# Patient Record
Sex: Female | Born: 1995 | Race: Black or African American | Hispanic: No | Smoking: Current every day smoker
Health system: Southern US, Community
[De-identification: ages and names within clinical notes are randomized; demographics above are authoritative.]

## PROBLEM LIST (undated history)

## (undated) ENCOUNTER — Inpatient Hospital Stay (HOSPITAL_COMMUNITY): Payer: Self-pay

## (undated) ENCOUNTER — Inpatient Hospital Stay: Payer: Self-pay

## (undated) DIAGNOSIS — Z3046 Encounter for surveillance of implantable subdermal contraceptive: Secondary | ICD-10-CM

## (undated) DIAGNOSIS — Z789 Other specified health status: Secondary | ICD-10-CM

## (undated) DIAGNOSIS — Z309 Encounter for contraceptive management, unspecified: Secondary | ICD-10-CM

## (undated) DIAGNOSIS — R768 Other specified abnormal immunological findings in serum: Secondary | ICD-10-CM

## (undated) HISTORY — DX: Other specified health status: Z78.9

## (undated) HISTORY — DX: Encounter for surveillance of implantable subdermal contraceptive: Z30.46

## (undated) HISTORY — PX: NO PAST SURGERIES: SHX2092

## (undated) HISTORY — DX: Encounter for contraceptive management, unspecified: Z30.9

## (undated) HISTORY — DX: Other specified abnormal immunological findings in serum: R76.8

---
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
## 2014-08-02 ENCOUNTER — Encounter: Payer: Self-pay | Admitting: Adult Health

## 2014-09-22 ENCOUNTER — Encounter: Payer: Self-pay | Admitting: Advanced Practice Midwife

## 2014-09-22 ENCOUNTER — Ambulatory Visit (INDEPENDENT_AMBULATORY_CARE_PROVIDER_SITE_OTHER): Payer: Medicaid Other | Admitting: Advanced Practice Midwife

## 2014-09-22 ENCOUNTER — Other Ambulatory Visit: Payer: Medicaid Other | Admitting: Advanced Practice Midwife

## 2014-09-22 VITALS — BP 110/60 | Ht 66.0 in | Wt 140.5 lb

## 2014-09-22 DIAGNOSIS — Z113 Encounter for screening for infections with a predominantly sexual mode of transmission: Secondary | ICD-10-CM

## 2014-09-22 DIAGNOSIS — N912 Amenorrhea, unspecified: Secondary | ICD-10-CM

## 2014-09-22 DIAGNOSIS — Z3202 Encounter for pregnancy test, result negative: Secondary | ICD-10-CM

## 2014-09-22 DIAGNOSIS — A599 Trichomoniasis, unspecified: Secondary | ICD-10-CM | POA: Insufficient documentation

## 2014-09-22 LAB — POCT URINE PREGNANCY: PREG TEST UR: NEGATIVE

## 2014-09-22 MED ORDER — METRONIDAZOLE 500 MG PO TABS: 2000.0000 mg | ORAL_TABLET | Freq: Once | ORAL | Status: DC

## 2014-09-22 MED ORDER — NORELGESTROMIN-ETH ESTRADIOL 150-35 MCG/24HR TD PTWK: 1.0000 | MEDICATED_PATCH | TRANSDERMAL | Status: DC

## 2014-09-22 NOTE — Patient Instructions (Addendum)
Trichomoniasis Trichomoniasis is an infection caused by an organism called Trichomonas. The infection can affect both women and men. In women, the outer female genitalia and the vagina are affected. In men, the penis is mainly affected, but the prostate and other reproductive organs can also be involved. Trichomoniasis is a sexually transmitted infection (STI) and is most often passed to another person through sexual contact.  RISK FACTORS  Having unprotected sexual intercourse.  Having sexual intercourse with an infected partner. SIGNS AND SYMPTOMS  Symptoms of trichomoniasis in women include:  Abnormal gray-green frothy vaginal discharge.  Itching and irritation of the vagina.  Itching and irritation of the area outside the vagina. Symptoms of trichomoniasis in men include:   Penile discharge with or without pain.  Pain during urination. This results from inflammation of the urethra. DIAGNOSIS  Trichomoniasis may be found during a Pap test or physical exam. Your health care provider may use one of the following methods to help diagnose this infection:  Examining vaginal discharge under a microscope. For men, urethral discharge would be examined.  Testing the pH of the vagina with a test tape.  Using a vaginal swab test that checks for the Trichomonas organism. A test is available that provides results within a few minutes.  Doing a culture test for the organism. This is not usually needed. TREATMENT   You may be given medicine to fight the infection. Women should inform their health care provider if they could be or are pregnant. Some medicines used to treat the infection should not be taken during pregnancy.  Your health care provider may recommend over-the-counter medicines or creams to decrease itching or irritation.  Your sexual partner will need to be treated if infected. HOME CARE INSTRUCTIONS   Take medicines only as directed by your health care provider.  Take  over-the-counter medicine for itching or irritation as directed by your health care provider.  Do not have sexual intercourse while you have the infection.  Women should not douche or wear tampons while they have the infection.  Discuss your infection with your partner. Your partner may have gotten the infection from you, or you may have gotten it from your partner.  Have your sex partner get examined and treated if necessary.  Practice safe, informed, and protected sex.  See your health care provider for other STI testing. SEEK MEDICAL CARE IF:   You still have symptoms after you finish your medicine.  You develop abdominal pain.  You have pain when you urinate.  You have bleeding after sexual intercourse.  You develop a rash.  Your medicine makes you sick or makes you throw up (vomit). MAKE SURE YOU:  Understand these instructions.  Will watch your condition.  Will get help right away if you are not doing well or get worse. Document Released: 03/13/2001 Document Revised: 02/01/2014 Document Reviewed: 06/29/2013 [facility] Patient Information 2015 [employer] [place]. This information is not intended to replace advice given to you by your health care provider. Make sure you discuss any questions you have with your health care provider. Ethinyl Estradiol; Norelgestromin skin patches What is this medicine? ETHINYL ESTRADIOL;NORELGESTROMIN (ETH in il es tra DYE ole; nor el JES troe min) skin patch is used as a contraceptive (birth control method). This medicine combines two types of female hormones, an estrogen and a progestin. This patch is used to prevent ovulation and pregnancy. This medicine may be used for other purposes; ask your health care provider or pharmacist if you have questions.  COMMON BRAND NAME(S): Ortho Christianne BorrowEvra, Xulane What should I tell my health care provider before I take this medicine? They need to know if you have or ever had any of these conditions: -abnormal  vaginal bleeding -blood vessel disease or blood clots -breast, cervical, endometrial, ovarian, liver, or uterine cancer -diabetes -gallbladder disease -heart disease or recent heart attack -high blood pressure -high cholesterol -kidney disease -liver disease -migraine headaches -stroke -systemic lupus erythematosus (SLE) -tobacco smoker -an unusual or allergic reaction to estrogens, progestins, other medicines, foods, dyes, or preservatives -pregnant or trying to get pregnant -breast-feeding How should I use this medicine? This patch is applied to the skin. Follow the directions on the prescription label. Apply to clean, dry, healthy skin on the buttock, abdomen, upper outer arm or upper torso, in a place where it will not be rubbed by tight clothing. Do not use lotions or other cosmetics on the site where the patch will go. Press the patch firmly in place for 10 seconds to ensure good contact with the skin. Change the patch every 7 days on the same day of the week for 3 weeks. You will then have a break from the patch for 1 week, after which you will apply a new patch. Do not use your medicine more often than directed. Contact your pediatrician regarding the use of this medicine in children. Special care may be needed. This medicine has been used in female children who have started having menstrual periods. A patient package insert for the product will be given with each prescription and refill. Read this sheet carefully each time. The sheet may change frequently. Overdosage: If you think you have taken too much of this medicine contact a poison control center or emergency room at once. NOTE: This medicine is only for you. Do not share this medicine with others. What if I miss a dose? You will need to replace your patch once a week as directed. If your patch is lost or falls off, contact your health care professional for advice. You may need to use another form of birth control if your patch  has been off for more than 1 day. What may interact with this medicine? -acetaminophen -antibiotics or medicines for infections, especially rifampin, rifabutin, rifapentine, and griseofulvin, and possibly penicillins or tetracyclines -aprepitant -ascorbic acid (vitamin C) -atorvastatin -barbiturate medicines, such as phenobarbital -bosentan -carbamazepine -caffeine -clofibrate -cyclosporine -dantrolene -doxercalciferol -felbamate -grapefruit juice -hydrocortisone -medicines for anxiety or sleeping problems, such as diazepam or temazepam -medicines for diabetes, including pioglitazone -modafinil -mycophenolate -nefazodone -oxcarbazepine -phenytoin -prednisolone -ritonavir or other medicines for HIV infection or AIDS -rosuvastatin -selegiline -soy isoflavones supplements -St. John's wort -tamoxifen or raloxifene -theophylline -thyroid hormones -topiramate -warfarin This list may not describe all possible interactions. Give your health care provider a list of all the medicines, herbs, non-prescription drugs, or dietary supplements you use. Also tell them if you smoke, drink alcohol, or use illegal drugs. Some items may interact with your medicine. What should I watch for while using this medicine? Visit your doctor or health care professional for regular checks on your progress. You will need a regular breast and pelvic exam and Pap smear while on this medicine. Use an additional method of contraception during the first cycle that you use this patch. If you have any reason to think you are pregnant, stop using this medicine right away and contact your doctor or health care professional. If you are using this medicine for hormone related problems, it may take several cycles  of use to see improvement in your condition. Smoking increases the risk of getting a blood clot or having a stroke while you are using hormonal birth control, especially if you are more than 19 years old. You  are strongly advised not to smoke. This medicine can make your body retain fluid, making your fingers, hands, or ankles swell. Your blood pressure can go up. Contact your doctor or health care professional if you feel you are retaining fluid. This medicine can make you more sensitive to the sun. Keep out of the sun. If you cannot avoid being in the sun, wear protective clothing and use sunscreen. Do not use sun lamps or tanning beds/booths. If you wear contact lenses and notice visual changes, or if the lenses begin to feel uncomfortable, consult your eye care specialist. In some women, tenderness, swelling, or minor bleeding of the gums may occur. Notify your dentist if this happens. Brushing and flossing your teeth regularly may help limit this. See your dentist regularly and inform your dentist of the medicines you are taking. If you are going to have elective surgery or a MRI, you may need to stop using this medicine before the surgery or MRI. Consult your health care professional for advice. This medicine does not protect you against HIV infection (AIDS) or any other sexually transmitted diseases. What side effects may I notice from receiving this medicine? Side effects that you should report to your doctor or health care professional as soon as possible: -breast tissue changes or discharge -changes in vaginal bleeding during your period or between your periods -chest pain -coughing up blood -dizziness or fainting spells -headaches or migraines -leg, arm or groin pain -severe or sudden headaches -stomach pain (severe) -sudden shortness of breath -sudden loss of coordination, especially on one side of the body -speech problems -symptoms of vaginal infection like itching, irritation or unusual discharge -tenderness in the upper abdomen -vomiting -weakness or numbness in the arms or legs, especially on one side of the body -yellowing of the eyes or skin Side effects that usually do not  require medical attention (report to your doctor or health care professional if they continue or are bothersome): -breakthrough bleeding and spotting that continues beyond the 3 initial cycles of pills -breast tenderness -mood changes, anxiety, depression, frustration, anger, or emotional outbursts -increased sensitivity to sun or ultraviolet light -nausea -skin rash, acne, or brown spots on the skin -weight gain (slight) This list may not describe all possible side effects. Call your doctor for medical advice about side effects. You may report side effects to FDA at 1-800-FDA-1088. Where should I keep my medicine? Keep out of the reach of children. Store at room temperature between 15 and 30 degrees C (59 and 86 degrees F). Keep the patch in its pouch until time of use. Throw away any unused medicine after the expiration date. Dispose of used patches properly. Since a used patch may still contain active hormones, fold the patch in half so that it sticks to itself prior to disposal. Throw away in a place where children or pets cannot reach. NOTE: This sheet is a summary. It may not cover all possible information. If you have questions about this medicine, talk to your doctor, pharmacist, or health care provider.  2015, [employer]. (2008-09-02 96:04:54)

## 2014-09-22 NOTE — Progress Notes (Signed)
[facility] Visit  Patient name: Kerri Soto MRN [id number]  Date of birth: 01-Sep-1996  CC & HPI:  Kerri Soto is a 18 y.o. African American female presenting today for STD screening and to change birth control.  Boyfriend told her "he had something" and "took pills.".  Has noticed and increased dc without odor.  Forgets to take her pills.  Is a week or so late for her period. [facility]  Wants the patch   Pertinent History Reviewed:  Medical & Surgical Hx:   Past Medical History  Diagnosis Date  . Medical history non-contributory   . Nexplanon removal 10/22/2013  . Contraceptive management 10/22/2013   Past Surgical History  Procedure Laterality Date  . No past surgeries     Current outpatient prescriptions: metroNIDAZOLE (FLAGYL) 500 MG tablet, Take 4 tablets (2,000 mg total) by mouth once., Disp: 4 tablet, Rfl: 0;  norelgestromin-ethinyl estradiol (ORTHO EVRA) 150-35 MCG/24HR transdermal patch, Place 1 patch onto the skin once a week., Disp: 3 patch, Rfl: 12 Social History: Reviewed -  reports that she has quit smoking. Her smoking use included Cigarettes. She smoked 0.00 packs per day. She has never used smokeless tobacco.  Objective Findings:  Vitals: BP 110/60 mmHg  Ht 5\' 6"  (1.676 m)  Wt 140 lb 8 oz (63.73 kg)  BMI 22.69 kg/m2  LMP 08/10/2014  Breastfeeding? No  Physical Examination: General appearance - alert, well appearing, and in no distress Mental status - alert, oriented to person, place, and time Pelvic - Vulva normal; SSE:  Yellow frothy discharge, no odor.  Wet prep + WBC and trichomonas.  No clue or yeast.  Results for orders placed or performed in visit on 09/22/14 (from the past 24 hour(s))  POCT urine pregnancy   Collection Time: 09/22/14  2:11 PM  Result Value Ref Range   Preg Test, Ur Negative      Assessment & Plan:  A:   Trichomonas   Contraception management P:  STD screening,    If Qhcg negative, may start  patch tomorros   F/U 4weeks for POC, Trich   CRESENZO-DISHMAN,Levora Werden CNM 09/22/2014 2:34 PM

## 2014-09-23 ENCOUNTER — Telehealth: Payer: Self-pay | Admitting: Advanced Practice Midwife

## 2014-09-23 LAB — HSV 2 ANTIBODY, IGG: HSV 2 GLYCOPROTEIN G AB, IGG: 11.96 IV — AB

## 2014-09-23 LAB — RPR

## 2014-09-23 LAB — GC/CHLAMYDIA PROBE AMP
CT PROBE, AMP APTIMA: NEGATIVE
GC Probe RNA: NEGATIVE

## 2014-09-23 LAB — HCG, QUANTITATIVE, PREGNANCY: hCG, Beta Chain, Quant, S: <2 m[IU]/mL

## 2014-09-23 LAB — HIV ANTIBODY (ROUTINE TESTING W REFLEX): HIV: NONREACTIVE

## 2014-09-23 NOTE — Telephone Encounter (Signed)
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 Pt told to call back on Monday for those results. Pt verbalized understanding.

## 2014-09-27 ENCOUNTER — Encounter: Payer: Self-pay | Admitting: Advanced Practice Midwife

## 2014-09-27 DIAGNOSIS — R768 Other specified abnormal immunological findings in serum: Secondary | ICD-10-CM

## 2014-09-27 DIAGNOSIS — R7689 Other specified abnormal immunological findings in serum: Secondary | ICD-10-CM | POA: Insufficient documentation

## 2014-09-27 HISTORY — DX: Other specified abnormal immunological findings in serum: R76.8

## 2014-10-20 ENCOUNTER — Ambulatory Visit (INDEPENDENT_AMBULATORY_CARE_PROVIDER_SITE_OTHER): Payer: Medicaid Other | Admitting: Advanced Practice Midwife

## 2014-10-20 ENCOUNTER — Encounter: Payer: Self-pay | Admitting: Advanced Practice Midwife

## 2014-10-20 VITALS — BP 112/80 | Wt 138.0 lb

## 2014-10-20 DIAGNOSIS — A599 Trichomoniasis, unspecified: Secondary | ICD-10-CM

## 2014-10-20 NOTE — Progress Notes (Signed)
[facility] Visit  Patient name: Kerri Soto MRN [id number]  Date of birth: 31-Jan-1996  CC & HPI:  Kerri Petitekeybah T Carmicheal is a 19 y.o. African American female presenting today for POC trich. Counseled about + HSV2  Pertinent History Reviewed:  Medical & Surgical Hx:   Past Medical History  Diagnosis Date  . Medical history non-contributory   . Nexplanon removal 10/22/2013  . Contraceptive management 10/22/2013  . 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 No past surgeries      Current outpatient prescriptions:  .  norelgestromin-ethinyl estradiol (ORTHO EVRA) 150-35 MCG/24HR transdermal patch, Place 1 patch onto the skin once a week., Disp: 3 patch, Rfl: 12 .  metroNIDAZOLE (FLAGYL) 500 MG tablet, Take 4 tablets (2,000 mg total) by mouth once. (Patient not taking: Reported on 10/20/2014), Disp: 4 tablet, Rfl: 0 Social History: Reviewed -  reports that she has quit smoking. Her smoking use included Cigarettes. She has never used smokeless tobacco.  Objective Findings:  Vitals: BP 112/80 mmHg  Wt 138 lb (62.596 kg)  LMP 08/10/2014  Physical Examination: General appearance - alert, well appearing, and in no distress Mental status - alert, oriented to person, place, and time Pelvic - SSE:  On period.  Wet prep negative  No results found for this or any previous visit (from the past 24 hour(s)).   Assessment & Plan:  A:   POC trich, neg P:     F/U prn Advised condoms   CRESENZO-DISHMAN,Trenna Kiely CNM 10/20/2014 2:56 PM

## 2015-02-08 ENCOUNTER — Encounter (HOSPITAL_COMMUNITY): Payer: Self-pay

## 2015-02-08 ENCOUNTER — Emergency Department (HOSPITAL_COMMUNITY)
Admission: EM | Admit: 2015-02-08 | Discharge: 2015-02-08 | Disposition: A | Discharge status: Home / Self Care | Payer: Medicaid Other | Attending: Emergency Medicine | Admitting: Emergency Medicine

## 2015-02-08 DIAGNOSIS — Z87891 Personal history of nicotine dependence: Secondary | ICD-10-CM | POA: Diagnosis not present

## 2015-02-08 DIAGNOSIS — Z3202 Encounter for pregnancy test, result negative: Secondary | ICD-10-CM | POA: Insufficient documentation

## 2015-02-08 DIAGNOSIS — Z113 Encounter for screening for infections with a predominantly sexual mode of transmission: Secondary | ICD-10-CM | POA: Diagnosis present

## 2015-02-08 DIAGNOSIS — Z202 Contact with and (suspected) exposure to infections with a predominantly sexual mode of transmission: Secondary | ICD-10-CM | POA: Insufficient documentation

## 2015-02-08 LAB — URINALYSIS, ROUTINE W REFLEX MICROSCOPIC
Bilirubin Urine: NEGATIVE
GLUCOSE, UA: NEGATIVE mg/dL
Hgb urine dipstick: NEGATIVE
Ketones, ur: NEGATIVE mg/dL
LEUKOCYTES UA: NEGATIVE
NITRITE: NEGATIVE
PH: 6.5 (ref 5.0–8.0)
Protein, ur: NEGATIVE mg/dL
SPECIFIC GRAVITY, URINE: 1.02 (ref 1.005–1.030)
Urobilinogen, UA: 0.2 mg/dL (ref 0.0–1.0)

## 2015-02-08 LAB — WET PREP, GENITAL
TRICH WET PREP: NONE SEEN
YEAST WET PREP: NONE SEEN

## 2015-02-08 LAB — POC URINE PREG, ED: PREG TEST UR: NEGATIVE

## 2015-02-08 MED ORDER — CEFTRIAXONE SODIUM 250 MG IJ SOLR
250.0000 mg | Freq: Once | INTRAMUSCULAR | Status: AC
  Administered 2015-02-08: 250 mg via INTRAMUSCULAR
  Filled 2015-02-08: qty 250

## 2015-02-08 MED ORDER — LIDOCAINE HCL (PF) 1 % IJ SOLN
INTRAMUSCULAR | Status: AC
Start: 2015-02-08 — End: 2015-02-08
  Administered 2015-02-08: 5 mL
  Filled 2015-02-08: qty 5

## 2015-02-08 MED ORDER — AZITHROMYCIN 250 MG PO TABS
1000.0000 mg | ORAL_TABLET | Freq: Once | ORAL | Status: AC
  Administered 2015-02-08: 1000 mg via ORAL
  Filled 2015-02-08: qty 4

## 2015-02-08 NOTE — Discharge Instructions (Signed)

## 2015-02-08 NOTE — ED Notes (Signed)
Reports boyfriend being checked for STD.s pt requesting to be checked as well.

## 2015-02-09 LAB — RPR: RPR Ser Ql: NONREACTIVE

## 2015-02-09 LAB — GC/CHLAMYDIA PROBE AMP (~~LOC~~) NOT AT ARMC
CHLAMYDIA, DNA PROBE: POSITIVE — AB
NEISSERIA GONORRHEA: NEGATIVE

## 2015-02-09 LAB — HIV ANTIBODY (ROUTINE TESTING W REFLEX): HIV Screen 4th Generation wRfx: NONREACTIVE

## 2015-02-10 ENCOUNTER — Telehealth (HOSPITAL_BASED_OUTPATIENT_CLINIC_OR_DEPARTMENT_OTHER): Payer: Self-pay | Admitting: Emergency Medicine

## 2015-02-10 NOTE — ED Provider Notes (Signed)
CSN: [id number]     Arrival date & time 02/08/15  1528 History   First MD Initiated Contact with Patient 02/08/15 1634     Chief Complaint  Patient presents with  . SEXUALLY TRANSMITTED DISEASE     (Consider location/radiation/quality/duration/timing/severity/associated sxs/prior Treatment) HPI   Judie Petitekeybah T Stelzner is a 19 y.o. female who presents to the Emergency Department requesting evaluation for possible STD.  She states that her sexual partner has noticed a discharge from his penis recently and she wants to be "checked for STD's"  She denies any symptoms at present including abdominal , pelvic pain, vaginal bleeding or discharge.  She also denies multiple sexual partners.     Past Medical History  Diagnosis Date  . Medical history non-contributory   . Nexplanon removal 10/22/2013  . Contraceptive management 10/22/2013  . 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 No past surgeries     Family History  Problem Relation Age of Onset  . Diabetes Father   . Hypertension Father   . Hyperlipidemia Father   . Diabetes Maternal Grandmother   . Diabetes Paternal Grandmother    History  Substance Use Topics  . Smoking status: Former Smoker    Types: Cigarettes  . Smokeless tobacco: Never Used  . Alcohol Use: No     Comment: not now   OB History    Gravida Para Term Preterm AB TAB SAB Ectopic Multiple Living   1 1 1       1      Review of Systems  Constitutional: Negative for fever, chills and fatigue.  HENT: Negative for trouble swallowing.   Respiratory: Negative for shortness of breath.   Cardiovascular: Negative for chest pain.  Gastrointestinal: Negative for nausea, vomiting, abdominal pain and blood in stool.  Genitourinary: Negative for dysuria, hematuria, flank pain, vaginal bleeding, vaginal discharge, vaginal pain and pelvic pain.  Musculoskeletal: Negative for myalgias, back pain, arthralgias, neck pain and neck stiffness.   Skin: Negative for rash.  Neurological: Negative for dizziness, weakness and numbness.  Hematological: Does not bruise/bleed easily.  All other systems reviewed and are negative.     Allergies  Review of patient's allergies indicates no known allergies.  Home Medications   Prior to Admission medications   Medication Sig Start Date End Date Taking? Authorizing Provider  metroNIDAZOLE (FLAGYL) 500 MG tablet Take 4 tablets (2,000 mg total) by mouth once. Patient not taking: Reported on 10/20/2014 09/22/14   Jacklyn ShellFrances Cresenzo-Dishmon, CNM  norelgestromin-ethinyl estradiol (ORTHO EVRA) 150-35 MCG/24HR transdermal patch Place 1 patch onto the skin once a week. Patient not taking: Reported on 02/08/2015 09/22/14   Jacklyn ShellFrances Cresenzo-Dishmon, CNM   BP 107/74 mmHg  Pulse 96  Temp(Src) 99 F (37.2 C) (Oral)  Resp 18  Ht 5\' 6"  (1.676 m)  Wt 136 lb (61.689 kg)  BMI 21.96 kg/m2  SpO2 100%  LMP 01/16/2015 Physical Exam  Constitutional: She is oriented to person, place, and time. She appears well-developed and well-nourished. No distress.  HENT:  Head: Normocephalic and atraumatic.  Mouth/Throat: Oropharynx is clear and moist.  Neck: Normal range of motion. Neck supple.  Cardiovascular: Normal rate, regular rhythm and normal heart sounds.   Pulmonary/Chest: Effort normal and breath sounds normal. No respiratory distress. She exhibits no tenderness.  Abdominal: Soft. She exhibits no distension. There is no tenderness.  Genitourinary: Vagina normal and uterus normal. There is no rash or tenderness on the right labia. There is no rash  or tenderness on the left labia. Cervix exhibits no motion tenderness, no discharge and no friability. Right adnexum displays no mass and no tenderness. Left adnexum displays no mass and no tenderness.  Musculoskeletal: Normal range of motion. She exhibits no tenderness.  Lymphadenopathy:    She has no cervical adenopathy.  Neurological: She is alert and oriented  to person, place, and time. She exhibits normal muscle tone. Coordination normal.  Skin: Skin is warm and dry.  Nursing note and vitals reviewed.   ED Course  Procedures (including critical care time) Labs Review Labs Reviewed  WET PREP, GENITAL - Abnormal; Notable for the following:    Clue Cells Wet Prep HPF POC FEW (*)    WBC, Wet Prep HPF POC MANY (*)    All other components within normal limits  GC/CHLAMYDIA PROBE AMP ([place]) - Abnormal; Notable for the following:    Chlamydia **POSITIVE** (*)    All other components within normal limits  URINALYSIS, ROUTINE W REFLEX MICROSCOPIC  RPR  HIV ANTIBODY (ROUTINE TESTING)  POC URINE PREG, ED    Imaging Review No results found.   EKG Interpretation None      MDM   Final diagnoses:  STD exposure   Pt is well appearing.  VSS.  Denies symptoms at present.  Patient and partner both treated with IM rocephin and zithromax.  Pt advised to f/u with health dept if needed.      Rosey Bathammy Lorea Kupfer, PA-C 02/10/15 2203  Raeford RazorStephen Kohut, MD 02/11/15 [phone]

## 2015-02-11 ENCOUNTER — Telehealth (HOSPITAL_COMMUNITY): Payer: Self-pay | Admitting: *Deleted

## 2015-08-09 ENCOUNTER — Emergency Department (HOSPITAL_COMMUNITY)
Admission: EM | Admit: 2015-08-09 | Discharge: 2015-08-09 | Disposition: A | Discharge status: Home / Self Care | Payer: Medicaid Other | Attending: Emergency Medicine | Admitting: Emergency Medicine

## 2015-08-09 ENCOUNTER — Encounter (HOSPITAL_COMMUNITY): Payer: Self-pay | Admitting: Emergency Medicine

## 2015-08-09 DIAGNOSIS — N390 Urinary tract infection, site not specified: Secondary | ICD-10-CM | POA: Diagnosis not present

## 2015-08-09 DIAGNOSIS — B9689 Other specified bacterial agents as the cause of diseases classified elsewhere: Secondary | ICD-10-CM

## 2015-08-09 DIAGNOSIS — N760 Acute vaginitis: Secondary | ICD-10-CM | POA: Insufficient documentation

## 2015-08-09 DIAGNOSIS — Z8619 Personal history of other infectious and parasitic diseases: Secondary | ICD-10-CM | POA: Insufficient documentation

## 2015-08-09 DIAGNOSIS — Z3202 Encounter for pregnancy test, result negative: Secondary | ICD-10-CM | POA: Diagnosis not present

## 2015-08-09 DIAGNOSIS — Z720 Tobacco use: Secondary | ICD-10-CM | POA: Insufficient documentation

## 2015-08-09 DIAGNOSIS — R300 Dysuria: Secondary | ICD-10-CM | POA: Diagnosis present

## 2015-08-09 LAB — URINALYSIS, ROUTINE W REFLEX MICROSCOPIC
BILIRUBIN URINE: NEGATIVE
GLUCOSE, UA: NEGATIVE mg/dL
Ketones, ur: NEGATIVE mg/dL
Nitrite: NEGATIVE
PROTEIN: 100 mg/dL — AB
UROBILINOGEN UA: 0.2 mg/dL (ref 0.0–1.0)
pH: 6 (ref 5.0–8.0)

## 2015-08-09 LAB — WET PREP, GENITAL
Trich, Wet Prep: NONE SEEN
Yeast Wet Prep HPF POC: NONE SEEN

## 2015-08-09 LAB — PREGNANCY, URINE: PREG TEST UR: NEGATIVE

## 2015-08-09 LAB — URINE MICROSCOPIC-ADD ON

## 2015-08-09 MED ORDER — CEPHALEXIN 500 MG PO CAPS
500.0000 mg | ORAL_CAPSULE | Freq: Once | ORAL | Status: AC
  Administered 2015-08-09: 500 mg via ORAL

## 2015-08-09 MED ORDER — PHENAZOPYRIDINE HCL 100 MG PO TABS
ORAL_TABLET | ORAL | Status: AC
  Filled 2015-08-09: qty 1

## 2015-08-09 MED ORDER — IBUPROFEN 800 MG PO TABS
ORAL_TABLET | ORAL | Status: AC
  Filled 2015-08-09: qty 1

## 2015-08-09 MED ORDER — PHENAZOPYRIDINE HCL 100 MG PO TABS
100.0000 mg | ORAL_TABLET | Freq: Once | ORAL | Status: AC
  Administered 2015-08-09: 100 mg via ORAL

## 2015-08-09 MED ORDER — METRONIDAZOLE 500 MG PO TABS
500.0000 mg | ORAL_TABLET | Freq: Once | ORAL | Status: AC
  Administered 2015-08-09: 500 mg via ORAL
  Filled 2015-08-09: qty 1

## 2015-08-09 MED ORDER — METRONIDAZOLE 500 MG PO TABS: 500.0000 mg | ORAL_TABLET | Freq: Two times a day (BID) | ORAL | Status: DC

## 2015-08-09 MED ORDER — CEPHALEXIN 500 MG PO CAPS
ORAL_CAPSULE | ORAL | Status: AC
  Filled 2015-08-09: qty 1

## 2015-08-09 MED ORDER — CEPHALEXIN 500 MG PO CAPS: 500.0000 mg | ORAL_CAPSULE | Freq: Four times a day (QID) | ORAL | Status: DC

## 2015-08-09 MED ORDER — IBUPROFEN 800 MG PO TABS
800.0000 mg | ORAL_TABLET | Freq: Once | ORAL | Status: AC
  Administered 2015-08-09: 800 mg via ORAL

## 2015-08-09 NOTE — Discharge Instructions (Signed)
Bacterial Vaginosis °Bacterial vaginosis is a vaginal infection that occurs when the normal balance of bacteria in the vagina is disrupted. It results from an overgrowth of certain bacteria. This is the most common vaginal infection in women of childbearing age. Treatment is important to prevent complications, especially in pregnant women, as it can cause a premature delivery. °CAUSES  °Bacterial vaginosis is caused by an increase in harmful bacteria that are normally present in smaller amounts in the vagina. Several different kinds of bacteria can cause bacterial vaginosis. However, the reason that the condition develops is not fully understood. °RISK FACTORS °Certain activities or behaviors can put you at an increased risk of developing bacterial vaginosis, including: °· Having a new sex partner or multiple sex partners. °· Douching. °· Using an intrauterine device (IUD) for contraception. °Women do not get bacterial vaginosis from toilet seats, bedding, swimming pools, or contact with objects around them. °SIGNS AND SYMPTOMS  °Some women with bacterial vaginosis have no signs or symptoms. Common symptoms include: °· Grey vaginal discharge. °· A fishlike odor with discharge, especially after sexual intercourse. °· Itching or burning of the vagina and vulva. °· Burning or pain with urination. °DIAGNOSIS  °Your health care provider will take a medical history and examine the vagina for signs of bacterial vaginosis. A sample of vaginal fluid may be taken. Your health care provider will look at this sample under a microscope to check for bacteria and abnormal cells. A vaginal pH test may also be done.  °TREATMENT  °Bacterial vaginosis may be treated with antibiotic medicines. These may be given in the form of a pill or a vaginal cream. A second round of antibiotics may be prescribed if the condition comes back after treatment. Because bacterial vaginosis increases your risk for sexually transmitted diseases, getting  treated can help reduce your risk for chlamydia, gonorrhea, HIV, and herpes. °HOME CARE INSTRUCTIONS  °· Only take over-the-counter or prescription medicines as directed by your health care provider. °· If antibiotic medicine was prescribed, take it as directed. Make sure you finish it even if you start to feel better. °· Tell all sexual partners that you have a vaginal infection. They should see their health care provider and be treated if they have problems, such as a mild rash or itching. °· During treatment, it is important that you follow these instructions: °· Avoid sexual activity or use condoms correctly. °· Do not douche. °· Avoid alcohol as directed by your health care provider. °· Avoid breastfeeding as directed by your health care provider. °SEEK MEDICAL CARE IF:  °· Your symptoms are not improving after 3 days of treatment. °· You have increased discharge or pain. °· You have a fever. °MAKE SURE YOU:  °· Understand these instructions. °· Will watch your condition. °· Will get help right away if you are not doing well or get worse. °FOR MORE INFORMATION  °Centers for Disease Control and Prevention, Division of STD Prevention: www.cdc.gov/std °American Sexual Health Association (ASHA): www.ashastd.org  °  °This information is not intended to replace advice given to you by your health care provider. Make sure you discuss any questions you have with your health care provider. °  °Document Released: 09/17/2005 Document Revised: 10/08/2014 Document Reviewed: 04/29/2013 °Elsevier Interactive Patient Education ©2016 [employer] ° °Urinary Tract Infection °Urinary tract infections (UTIs) can develop anywhere along your urinary tract. Your urinary tract is your body's drainage system for removing wastes and extra water. Your urinary tract includes two kidneys, two ureters,   a bladder, and a urethra. Your kidneys are a pair of bean-shaped organs. Each kidney is about the size of your fist. They are located below  your ribs, one on each side of your spine. °CAUSES °Infections are caused by microbes, which are microscopic organisms, including fungi, viruses, and bacteria. These organisms are so small that they can only be seen through a microscope. Bacteria are the microbes that most commonly cause UTIs. °SYMPTOMS  °Symptoms of UTIs may vary by age and gender of the patient and by the location of the infection. Symptoms in young women typically include a frequent and intense urge to urinate and a painful, burning feeling in the bladder or urethra during urination. Older women and men are more likely to be tired, shaky, and weak and have muscle aches and abdominal pain. A fever may mean the infection is in your kidneys. Other symptoms of a kidney infection include pain in your back or sides below the ribs, nausea, and vomiting. °DIAGNOSIS °To diagnose a UTI, your caregiver will ask you about your symptoms. Your caregiver will also ask you to provide a urine sample. The urine sample will be tested for bacteria and white blood cells. White blood cells are made by your body to help fight infection. °TREATMENT  °Typically, UTIs can be treated with medication. Because most UTIs are caused by a bacterial infection, they usually can be treated with the use of antibiotics. The choice of antibiotic and length of treatment depend on your symptoms and the type of bacteria causing your infection. °HOME CARE INSTRUCTIONS °· If you were prescribed antibiotics, take them exactly as your caregiver instructs you. Finish the medication even if you feel better after you have only taken some of the medication. °· Drink enough water and fluids to keep your urine clear or pale yellow. °· Avoid caffeine, tea, and carbonated beverages. They tend to irritate your bladder. °· Empty your bladder often. Avoid holding urine for long periods of time. °· Empty your bladder before and after sexual intercourse. °· After a bowel movement, women should cleanse  from front to back. Use each tissue only once. °SEEK MEDICAL CARE IF:  °· You have back pain. °· You develop a fever. °· Your symptoms do not begin to resolve within 3 days. °SEEK IMMEDIATE MEDICAL CARE IF:  °· You have severe back pain or lower abdominal pain. °· You develop chills. °· You have nausea or vomiting. °· You have continued burning or discomfort with urination. °MAKE SURE YOU:  °· Understand these instructions. °· Will watch your condition. °· Will get help right away if you are not doing well or get worse. °  °This information is not intended to replace advice given to you by your health care provider. Make sure you discuss any questions you have with your health care provider. °  °Document Released: 06/27/2005 Document Revised: 06/08/2015 Document Reviewed: 10/26/2011 °Elsevier Interactive Patient Education ©2016 [employer] Inc. ° °

## 2015-08-09 NOTE — ED Notes (Signed)
Pt c/o lower abd pain with spotting. Pt states home pregnancy test was negative.

## 2015-08-09 NOTE — ED Provider Notes (Signed)
TIME SEEN: 2:15 AM  CHIEF COMPLAINT: Dysuria, suprapubic pain  HPI: Pt is a 19 y.o. female who is a G1 P1 who presents emergency department with several days of dysuria, suprapubic pressure. States she has noticed some blood on the toilet paper after she urinates and wipes. States her last period was one week ago. Has had Chlamydia in the past has been treated. Sexual active with one partner. No fevers, chills, nausea, vomiting or diarrhea. No vaginal discharge.  ROS: See HPI Constitutional: no fever  Eyes: no drainage  ENT: no runny nose   Cardiovascular:  no chest pain  Resp: no SOB  GI: no vomiting GU: no dysuria Integumentary: no rash  Allergy: no hives  Musculoskeletal: no leg swelling  Neurological: no slurred speech ROS otherwise negative  PAST MEDICAL HISTORY/PAST SURGICAL HISTORY:  Past Medical History  Diagnosis Date  . Medical history non-contributory   . Nexplanon removal 10/22/2013  . Contraceptive management 10/22/2013  . 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 Authorizing Provider  metroNIDAZOLE (FLAGYL) 500 MG tablet Take 4 tablets (2,000 mg total) by mouth once. Patient not taking: Reported on 10/20/2014 09/22/14   Jacklyn Shell, CNM  norelgestromin-ethinyl estradiol (ORTHO EVRA) 150-35 MCG/24HR transdermal patch Place 1 patch onto the skin once a week. Patient not taking: Reported on 02/08/2015 09/22/14   Jacklyn Shell, CNM    ALLERGIES:  No Known Allergies  SOCIAL HISTORY:  Social History  Substance Use Topics  . Smoking status: Current Every Day Smoker    Types: Cigarettes  . Smokeless tobacco: Never Used  . Alcohol Use: No     Comment: not now    FAMILY HISTORY: Family History  Problem Relation Age of Onset  . Diabetes Father   . Hypertension Father   . Hyperlipidemia Father   . Diabetes Maternal Grandmother   . Diabetes Paternal Grandmother      EXAM: BP 116/86 mmHg  Pulse 81  Temp(Src) 97.4 F (36.3 C) (Oral)  Resp 16  Ht  (1.676 m)  Wt 134 lb (60.782 kg)  BMI 21.64 kg/m2  SpO2 100% CONSTITUTIONAL: Alert and oriented and responds appropriately to questions. Well-appearing; well-nourished, afebrile, smiling, laughing, nontoxic HEAD: Normocephalic EYES: Conjunctivae clear, PERRL ENT: normal nose; no rhinorrhea; moist mucous membranes; pharynx without lesions noted NECK: Supple, no meningismus, no LAD  CARD: RRR; S1 and S2 appreciated; no murmurs, no clicks, no rubs, no gallops RESP: Normal chest excursion without splinting or tachypnea; breath sounds clear and equal bilaterally; no wheezes, no rhonchi, no rales, no hypoxia or respiratory distress, speaking full sentences ABD/GI: Normal bowel sounds; non-distended; soft, minimal suprapubic tenderness, no tenderness at McBurney's point, no rebound, no guarding, no peritoneal signs GU:  Normal external genitalia. No lesions, rashes noted. Patient has no vaginal bleeding on exam. Small amount of thick white vaginal discharge.  No adnexal tenderness or fullness, no cervical motion tenderness. Cervix is not appear friable.   BACK:  The back appears normal and is non-tender to palpation, there is no CVA tenderness EXT: Normal ROM in all joints; non-tender to palpation; no edema; normal capillary refill; no cyanosis, no calf tenderness or swelling    SKIN: Normal color for age and race; warm NEURO: Moves all extremities equally, sensation to light touch intact diffusely, cranial nerves II through XII intact PSYCH: The patient's mood and manner are appropriate. Grooming and personal hygiene are appropriate.  MEDICAL DECISION  MAKING: Patient here with severe pubic pain, dysuria. There is no vaginal bleeding on her pelvic exam. Suspect the blood that she seen on her total paper is from hematuria. She does appear to have a UTI. Culture pending. Pregnancy test negative. She does have  bacterial vaginosis, will treat with Flagyl. Other pelvic cultures pending. We'll discharge with prescriptions for Keflex and Flagyl. She is not tender at McBurney's point. Doubt appendicitis.   I do not feel there is any life-threatening condition present. Discussed all results, exam findings with patient. I feel the patient is safe to be discharged home without further emergent workup. Discussed usual and customary return precautions. Patient and family (if present) verbalize understanding and are comfortable with this plan.  Patient will follow-up with their primary care provider. If they do not have a primary care provider, information for follow-up has been provided to them. All questions have been answered.           Layla MawKristen N Jolly Bleicher, DO 08/09/15 [phone]

## 2015-08-10 LAB — GC/CHLAMYDIA PROBE AMP (~~LOC~~) NOT AT ARMC
Chlamydia: NEGATIVE
NEISSERIA GONORRHEA: NEGATIVE

## 2015-08-10 LAB — URINE CULTURE

## 2015-12-03 ENCOUNTER — Emergency Department (HOSPITAL_COMMUNITY)
Admission: EM | Admit: 2015-12-03 | Discharge: 2015-12-03 | Disposition: A | Discharge status: Home / Self Care | Payer: Medicaid Other | Attending: Emergency Medicine | Admitting: Emergency Medicine

## 2015-12-03 ENCOUNTER — Encounter (HOSPITAL_COMMUNITY): Payer: Self-pay | Admitting: Emergency Medicine

## 2015-12-03 DIAGNOSIS — B86 Scabies: Secondary | ICD-10-CM | POA: Diagnosis not present

## 2015-12-03 DIAGNOSIS — Z792 Long term (current) use of antibiotics: Secondary | ICD-10-CM | POA: Insufficient documentation

## 2015-12-03 DIAGNOSIS — F17210 Nicotine dependence, cigarettes, uncomplicated: Secondary | ICD-10-CM | POA: Insufficient documentation

## 2015-12-03 DIAGNOSIS — R21 Rash and other nonspecific skin eruption: Secondary | ICD-10-CM | POA: Diagnosis present

## 2015-12-03 MED ORDER — PERMETHRIN 5 % EX CREA: TOPICAL_CREAM | CUTANEOUS | Status: DC

## 2015-12-03 NOTE — Discharge Instructions (Signed)

## 2015-12-03 NOTE — ED Provider Notes (Signed)
CSN: [id number]     Arrival date & time 12/03/15  1422 History   First MD Initiated Contact with Patient 12/03/15 1451     Chief Complaint  Patient presents with  . Rash     (Consider location/radiation/quality/duration/timing/severity/associated sxs/prior Treatment) Patient is a 20 y.o. female presenting with rash. The history is provided by the patient. No language interpreter was used.  Rash Location:  Full body Severity:  Mild Timing:  Constant Chronicity:  New Relieved by:  Nothing Ineffective treatments:  None tried  Pt reports her child had scabies.  Pt complains of itching. Past Medical History  Diagnosis Date  . Medical history non-contributory   . Nexplanon removal 10/22/2013  . Contraceptive management 10/22/2013  . 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 No past surgeries     Family History  Problem Relation Age of Onset  . Diabetes Father   . Hypertension Father   . Hyperlipidemia Father   . Diabetes Maternal Grandmother   . Diabetes Paternal Grandmother    Social History  Substance Use Topics  . Smoking status: Current Every Day Smoker -- 0.50 packs/day for 1 years    Types: Cigarettes  . Smokeless tobacco: Never Used  . Alcohol Use: No   OB History    Gravida Para Term Preterm AB TAB SAB Ectopic Multiple Living   Review of Systems  Skin: Positive for rash.  All other systems reviewed and are negative.     Allergies  Review of patient's allergies indicates no known allergies.  Home Medications   Prior to Admission medications   Medication Sig Start Date End Date Taking? Authorizing Provider  cephALEXin (KEFLEX) 500 MG capsule Take 1 capsule (500 mg total) by mouth 4 (four) times daily. 08/09/15   Kristen N Ward, DO  metroNIDAZOLE (FLAGYL) 500 MG tablet Take 1 tablet (500 mg total) by mouth 2 (two) times daily. 08/09/15   Kristen N Ward, DO  permethrin (ELIMITE) 5 % cream Apply to  affected area once 12/03/15   Lonia Skinner Avril Busser, PA-C   BP 97/66 mmHg  Pulse 90  Temp(Src) 98.2 F (36.8 C) (Oral)  Resp 18  Ht  (1.676 m)  Wt 58.968 kg  BMI 20.99 kg/m2  SpO2 100%  LMP 11/26/2015 (LMP Unknown) Physical Exam  Constitutional: She is oriented to person, place, and time. She appears well-developed and well-nourished.  HENT:  Head: Normocephalic.  Eyes: EOM are normal.  Neck: Normal range of motion.  Pulmonary/Chest: Effort normal.  Abdominal: She exhibits no distension.  Musculoskeletal: Normal range of motion.  Neurological: She is alert and oriented to person, place, and time.  Skin:  No rash  Psychiatric: She has a normal mood and affect.  Nursing note and vitals reviewed.   ED Course  Procedures (including critical care time) Labs Review Labs Reviewed - No data to display  Imaging Review No results found. I have personally reviewed and evaluated these images and lab results as part of my medical decision-making.   EKG Interpretation None      MDM Pt counseled on scabies and treatment   Final diagnoses:  Scabies    Meds ordered this encounter  Medications  . permethrin (ELIMITE) 5 % cream    Sig: Apply to affected area once    Dispense:  120 g    Refill:  1    Order  Specific Question:  Supervising Provider    Answer:  Bethann BerkshireZAMMIT, JOSEPH [[id number]]  An After Visit Summary was printed and given to the patient.    Lonia SkinnerLeslie K RidgevilleSofia, PA-C 12/03/15 1556  Bethann BerkshireJoseph Zammit, MD 12/04/15 [phone]

## 2015-12-03 NOTE — ED Notes (Signed)
Per patient daughter seen by PCP on Wednesday and diagnosed with scabies. Patient states she did have itching and rash to back and hands. Denies any now.

## 2016-11-08 ENCOUNTER — Emergency Department (HOSPITAL_COMMUNITY)
Admission: EM | Admit: 2016-11-08 | Discharge: 2016-11-08 | Disposition: A | Discharge status: Home / Self Care | Payer: Self-pay | Attending: Emergency Medicine | Admitting: Emergency Medicine

## 2016-11-08 ENCOUNTER — Emergency Department (HOSPITAL_COMMUNITY): Payer: Self-pay

## 2016-11-08 ENCOUNTER — Encounter (HOSPITAL_COMMUNITY): Payer: Self-pay

## 2016-11-08 DIAGNOSIS — O99331 Smoking (tobacco) complicating pregnancy, first trimester: Secondary | ICD-10-CM | POA: Insufficient documentation

## 2016-11-08 DIAGNOSIS — O26891 Other specified pregnancy related conditions, first trimester: Secondary | ICD-10-CM | POA: Insufficient documentation

## 2016-11-08 DIAGNOSIS — R102 Pelvic and perineal pain: Secondary | ICD-10-CM

## 2016-11-08 DIAGNOSIS — Z3A01 Less than 8 weeks gestation of pregnancy: Secondary | ICD-10-CM | POA: Insufficient documentation

## 2016-11-08 DIAGNOSIS — R110 Nausea: Secondary | ICD-10-CM | POA: Insufficient documentation

## 2016-11-08 DIAGNOSIS — R1084 Generalized abdominal pain: Secondary | ICD-10-CM | POA: Insufficient documentation

## 2016-11-08 DIAGNOSIS — F17210 Nicotine dependence, cigarettes, uncomplicated: Secondary | ICD-10-CM | POA: Insufficient documentation

## 2016-11-08 DIAGNOSIS — N898 Other specified noninflammatory disorders of vagina: Secondary | ICD-10-CM | POA: Insufficient documentation

## 2016-11-08 LAB — URINALYSIS, ROUTINE W REFLEX MICROSCOPIC
BILIRUBIN URINE: NEGATIVE
Glucose, UA: NEGATIVE mg/dL
KETONES UR: 20 mg/dL — AB
LEUKOCYTES UA: NEGATIVE
NITRITE: NEGATIVE
Protein, ur: NEGATIVE mg/dL
Specific Gravity, Urine: 1.025 (ref 1.005–1.030)
pH: 5 (ref 5.0–8.0)

## 2016-11-08 LAB — WET PREP, GENITAL
SPERM: NONE SEEN
Trich, Wet Prep: NONE SEEN
WBC, Wet Prep HPF POC: NONE SEEN
Yeast Wet Prep HPF POC: NONE SEEN

## 2016-11-08 LAB — HCG, QUANTITATIVE, PREGNANCY: HCG, BETA CHAIN, QUANT, S: 1900 m[IU]/mL — AB (ref <5)

## 2016-11-08 LAB — POC URINE PREG, ED: Preg Test, Ur: POSITIVE — AB

## 2016-11-08 MED ORDER — PRENATAL VITAMINS 0.8 MG PO TABS: 1.0000 | ORAL_TABLET | Freq: Every day | ORAL | 0 refills | Status: DC

## 2016-11-08 NOTE — ED Triage Notes (Signed)
Pt states she wants to know if she is pregnant, states she has had lower abd cramping x 2 weeks, states lmp was early January.

## 2016-11-08 NOTE — ED Notes (Signed)
Pelvic Cart to bedside 

## 2016-11-08 NOTE — Discharge Instructions (Signed)
PLEASE RETURN TO THE ER FOR ANY SEVERE ABDOMINAL PAIN OR VAGINAL BLEEDING OVER THE NEXT 48 HOURS

## 2016-11-08 NOTE — ED Provider Notes (Signed)
[facility]-EMERGENCY DEPT Provider Note   CSN: [id number] Arrival date & time: 11/08/16  0131     History   Chief Complaint Chief Complaint  Patient presents with  . Abdominal Pain    x 2 weeks    HPI Judie Petitekeybah T Lukas is a 21 y.o. female.  The history is provided by the patient.  Abdominal Pain   This is a new problem. The current episode started more than 1 week ago. The problem occurs daily. The problem has not changed since onset.The pain is located in the generalized abdominal region. The pain is mild. Pertinent negatives include fever, diarrhea, vomiting and dysuria. Nothing aggravates the symptoms. Nothing relieves the symptoms.   Patient reports abdominal cramping for past 2 weeks She also reports mild nausea No fever/vomiting She thinks she is pregnant (LMP January) but had negative pregnancy tests at home Denies h/o ectopic pregnancy Past Medical History:  Diagnosis Date  . Contraceptive management 10/22/2013  . 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 Medical history non-contributory   . Nexplanon removal 10/22/2013    Patient Active Problem List   Diagnosis Date Noted  . HSV-2 seropositive   . Trichomonal infection 09/22/2014  . Nexplanon removal 10/22/2013  . Contraceptive management 10/22/2013    Past Surgical History:  Procedure Laterality Date  . NO PAST SURGERIES      OB History    Gravida Para Term Preterm AB Living   1 1 1     1    SAB TAB Ectopic Multiple Live Births           1       Home Medications    Prior to Admission medications   Medication Sig Start Date End Date Taking? Authorizing Provider  cephALEXin (KEFLEX) 500 MG capsule Take 1 capsule (500 mg total) by mouth 4 (four) times daily. 08/09/15   Kristen N Ward, DO  metroNIDAZOLE (FLAGYL) 500 MG tablet Take 1 tablet (500 mg total) by mouth 2 (two) times daily. 08/09/15   Kristen N Ward, DO  permethrin (ELIMITE) 5 % cream Apply to affected area once 12/03/15   Elson AreasLeslie K Sofia, PA-C    Family  History Family History  Problem Relation Age of Onset  . Diabetes Father   . Hypertension Father   . Hyperlipidemia Father   . Diabetes Maternal Grandmother   . Diabetes Paternal Grandmother     Social History Social History  Substance Use Topics  . Smoking status: Current Every Day Smoker    Packs/day: 0.50    Years: 1.00    Types: Cigarettes  . Smokeless tobacco: Never Used  . Alcohol use No     Allergies   Patient has no known allergies.   Review of Systems Review of Systems  Constitutional: Negative for fever.  Gastrointestinal: Positive for abdominal pain. Negative for diarrhea and vomiting.  Genitourinary: Negative for dysuria, vaginal bleeding and vaginal discharge.  All other systems reviewed and are negative.    Physical Exam Updated Vital Signs BP 123/76 (BP Location: Right Arm)   Pulse 91   Temp 97.7 F (36.5 C) (Oral)   Resp 16   Ht 5\' 6"  (1.676 m)   Wt 57.6 kg   LMP 10/01/2016   SpO2 100%   BMI 20.50 kg/m   Physical Exam  CONSTITUTIONAL: Well developed/well nourished HEAD: Normocephalic/atraumatic EYES: EOMI/PERRL ENMT: Mucous membranes moist NECK: supple no meningeal signs SPINE/BACK:entire spine nontender CV: S1/S2 noted, no murmurs/rubs/gallops noted LUNGS: Lungs are clear to auscultation bilaterally, no  apparent distress ABDOMEN: soft, nontender, no rebound or guarding, bowel sounds noted throughout abdomen GU:no cva tenderness, small amount of whitish discharge, no vaginal bleeding, no CMT, nurse chaperone present for exam  NEURO: Pt is awake/alert/appropriate, moves all extremitiesx4.  No facial droop.   EXTREMITIES: pulses normal/equal, full ROM SKIN: warm, color normal PSYCH: no abnormalities of mood noted, alert and oriented to situation  ED Treatments / Results  Labs (all labs ordered are listed, but only abnormal results are displayed) Labs Reviewed  WET PREP, GENITAL - Abnormal; Notable for the following:       Result  Value   Clue Cells Wet Prep HPF POC PRESENT (*)    All other components within normal limits  URINALYSIS, ROUTINE W REFLEX MICROSCOPIC - Abnormal; Notable for the following:    APPearance HAZY (*)    Hgb urine dipstick SMALL (*)    Ketones, ur 20 (*)    Bacteria, UA FEW (*)    All other components within normal limits  HCG, QUANTITATIVE, PREGNANCY - Abnormal; Notable for the following:    hCG, Beta Chain, Quant, S 1,900 (*)    All other components within normal limits  POC URINE PREG, ED - Abnormal; Notable for the following:    Preg Test, Ur POSITIVE (*)    All other components within normal limits  GC/CHLAMYDIA PROBE AMP ([place]) NOT AT [facility]    EKG  EKG Interpretation None       Radiology US Ob Comp Less 14 Wks  Result Date: 11/08/2016 CLINICAL DATA:  Right pelvic pain for 2 weeks. Estimated gestational age by LMP is 5 weeks 3 days. Quantitative beta HCG is 1,900. EXAM: OBSTETRIC <14 WK [place] AND TRANSVAGINAL OB US TECHNIQUE: Both transabdominal and transvaginal ultrasound examinations were performed for complete evaluation of the gestation as well as the maternal uterus, adnexal regions, and pelvic cul-de-sac. Transvaginal technique was performed to assess early pregnancy. COMPARISON:  None. FINDINGS: Intrauterine gestational sac: No intrauterine gestational sac identified. Yolk sac:  Not identified. Embryo:  Not Visualized. Cardiac Activity: Not Visualized. Maternal uterus/adnexae: Uterus is anteverted. No myometrial mass lesions identified. Endometrium is mildly thickened and somewhat heterogeneous. No endometrial fluid collections identified. Both ovaries are visualized and appear normal. No abnormal adnexal masses. Flow is demonstrated on both ovaries with color flow Doppler imaging. Small amount of free fluid in the pelvis. IMPRESSION: No intrauterine gestational sac, yolk sac, or fetal pole identified. Differential considerations include intrauterine pregnancy too early to be  sonographically visualized, missed abortion, or ectopic pregnancy. Followup ultrasound is recommended in 10-14 days for further evaluation. Electronically Signed   By: Burman Nieves M.D.   On: 11/08/2016 04:18   US Ob Transvaginal  Result Date: 11/08/2016 CLINICAL DATA:  Right pelvic pain for 2 weeks. Estimated gestational age by LMP is 5 weeks 3 days. Quantitative beta HCG is 1,900. EXAM: OBSTETRIC <14 WK [place] AND TRANSVAGINAL OB US TECHNIQUE: Both transabdominal and transvaginal ultrasound examinations were performed for complete evaluation of the gestation as well as the maternal uterus, adnexal regions, and pelvic cul-de-sac. Transvaginal technique was performed to assess early pregnancy. COMPARISON:  None. FINDINGS: Intrauterine gestational sac: No intrauterine gestational sac identified. Yolk sac:  Not identified. Embryo:  Not Visualized. Cardiac Activity: Not Visualized. Maternal uterus/adnexae: Uterus is anteverted. No myometrial mass lesions identified. Endometrium is mildly thickened and somewhat heterogeneous. No endometrial fluid collections identified. Both ovaries are visualized and appear normal. No abnormal adnexal masses. Flow is demonstrated on both ovaries  with color flow Doppler imaging. Small amount of free fluid in the pelvis. IMPRESSION: No intrauterine gestational sac, yolk sac, or fetal pole identified. Differential considerations include intrauterine pregnancy too early to be sonographically visualized, missed abortion, or ectopic pregnancy. Followup ultrasound is recommended in 10-14 days for further evaluation. Electronically Signed   By: Burman Nieves M.D.   On: 11/08/2016 04:18    Procedures Procedures (including critical care time)  Medications Ordered in ED Medications - No data to display   Initial Impression / Assessment and Plan / ED Course  I have reviewed the triage vital signs and the nursing notes.  Pertinent labs & imaging results that were available during  my care of the patient were reviewed by me and considered in my medical decision making (see chart for details).     3:06 AM Pt will need formal US imaging to rule in IUP     PT WITH HCG AT 1900, BUT ULTRASOUND DOES NOT REVEAL IUP SHE IS ASYMPTOMATIC D/W DR Despina Hidden VIA PHONE INFORMED HIM OF QUANT LEVEL AND US FINDINGS HE WILL F/U IN OFFICE ON 2/12 FOR REPEAT QUANT SHE IS TO RETURN IF ANY RETURN OF ABDOMINAL PAIN OR VAGINAL BLEEDING PT AGREEABLE WITH PLAN I ADVISED HER TO QUIT SMOKING  Final Clinical Impressions(s) / ED Diagnoses   Final diagnoses:  Less than [redacted] weeks gestation of pregnancy    New Prescriptions New Prescriptions   PRENATAL MULTIVIT-MIN-FE-FA (PRENATAL VITAMINS) 0.8 MG TABLET    Take 1 tablet by mouth daily.     Zadie Rhine, MD 11/08/16 [id number]

## 2016-11-09 LAB — GC/CHLAMYDIA PROBE AMP (~~LOC~~) NOT AT ARMC
Chlamydia: NEGATIVE
Neisseria Gonorrhea: NEGATIVE

## 2016-11-13 ENCOUNTER — Other Ambulatory Visit: Payer: Self-pay | Admitting: Obstetrics & Gynecology

## 2016-11-13 ENCOUNTER — Telehealth: Payer: Self-pay | Admitting: *Deleted

## 2016-11-13 DIAGNOSIS — O3680X0 Pregnancy with inconclusive fetal viability, not applicable or unspecified: Secondary | ICD-10-CM

## 2016-11-13 NOTE — Telephone Encounter (Signed)
Patient called stating she is having lower abdominal pain on the right side. I advised patient that if she felt like the pain was severe, then [facility]. If she felt that the pain was not bad, we could put her on the schedule for tomorrow at 3pm. Patient stated she would come to the appointment tomorrow. Advised [facility] to [facility] if pain became worse. Patient verbalized understanding.

## 2016-11-14 ENCOUNTER — Encounter: Payer: Self-pay | Admitting: Advanced Practice Midwife

## 2016-11-14 ENCOUNTER — Other Ambulatory Visit: Payer: Self-pay | Admitting: Obstetrics & Gynecology

## 2016-11-14 ENCOUNTER — Ambulatory Visit (INDEPENDENT_AMBULATORY_CARE_PROVIDER_SITE_OTHER): Payer: Self-pay | Admitting: Advanced Practice Midwife

## 2016-11-14 ENCOUNTER — Ambulatory Visit (INDEPENDENT_AMBULATORY_CARE_PROVIDER_SITE_OTHER): Payer: Medicaid Other

## 2016-11-14 VITALS — BP 104/64 | HR 88 | Ht 66.0 in | Wt 125.0 lb

## 2016-11-14 DIAGNOSIS — R1030 Lower abdominal pain, unspecified: Secondary | ICD-10-CM

## 2016-11-14 DIAGNOSIS — R1032 Left lower quadrant pain: Secondary | ICD-10-CM

## 2016-11-14 DIAGNOSIS — O3680X0 Pregnancy with inconclusive fetal viability, not applicable or unspecified: Secondary | ICD-10-CM

## 2016-11-14 DIAGNOSIS — Z3A01 Less than 8 weeks gestation of pregnancy: Secondary | ICD-10-CM

## 2016-11-14 DIAGNOSIS — Z3490 Encounter for supervision of normal pregnancy, unspecified, unspecified trimester: Secondary | ICD-10-CM

## 2016-11-14 NOTE — Progress Notes (Signed)
US 6+3 wks GS w/ys,no fetal pole seen,normal ov's bilat,pt is seeing Drenda FreezeFran following ultrasound

## 2016-11-14 NOTE — Progress Notes (Signed)
[facility] Visit  Patient name: Kerri Soto MRN [id number]  Date of birth: [birth year]  CC & HPI:  Kerri Petitekeybah T Selke is a 21 y.o. African American female presenting today for US. Had UPT+, US last  week did not show anything in the uterus.  Having some lower abdominalk pain (midline) and US today showed GS/YS, no fetal pole yet.  Plans TAB.   Pertinent History Reviewed:  Medical & Surgical Hx:   Past Medical History:  Diagnosis Date  . Contraceptive management 10/22/2013  . 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 Medical history non-contributory   . Nexplanon removal 10/22/2013   Past Surgical History:  Procedure Laterality Date  . NO PAST SURGERIES     Family History  Problem Relation Age of Onset  . Diabetes Father   . Hypertension Father   . Hyperlipidemia Father   . Diabetes Maternal Grandmother   . Diabetes Paternal Grandmother     Current Outpatient Prescriptions:  .  Prenatal Multivit-Min-Fe-FA (PRENATAL VITAMINS) 0.8 MG tablet, Take 1 tablet by mouth daily., Disp: 30 tablet, Rfl: 0 Social History: Reviewed -  reports that she has quit smoking. Her smoking use included Cigarettes. She has a 0.50 pack-year smoking history. She has never used smokeless tobacco.  Review of Systems:   Constitutional: Negative for fever and chills Eyes: Negative for visual disturbances Respiratory: Negative for shortness of breath, dyspnea Cardiovascular: Negative for chest pain or palpitations  Gastrointestinal: Negative for vomiting, diarrhea and constipation; no abdominal pain today, a little cramping Genitourinary: Negative for dysuria and urgency, vaginal irritation or itching Musculoskeletal: Negative for back pain, joint pain, myalgias  Neurological: Negative for dizziness and headaches    Objective Findings:    Physical Examination: General appearance - well appearing, and in no distress Mental status - alert, oriented to person, place, and time Chest:  Normal  respiratory effort Heart - normal rate and regular rhythm Abdomen:  Soft, nontender Pelvic: deferred Musculoskeletal:  Normal range of motion without pain Extremities:  No edema  US 6+3 wks GS w/ys,no fetal pole seen,normal ov's bilat,pt is seeing Drenda FreezeFran following ultrasound  No results found for this or any previous visit (from the past 24 hour(s)).    Assessment & Plan:  A:   Early IUP, viabilitiy unconfirmed P:  Plans TAB; start PNC in 3-4 weeks if changes mind CRESENZO-DISHMAN,Tameya Kuznia CNM 11/14/2016 4:29 PM

## 2016-11-22 ENCOUNTER — Ambulatory Visit: Payer: Medicaid Other | Admitting: Adult Health

## 2016-11-22 ENCOUNTER — Other Ambulatory Visit: Payer: Medicaid Other

## 2018-12-06 IMAGING — US US OB TRANSVAGINAL
1 series · 13 of 28 positions shown · non-contrast
Comparison: None.

CLINICAL DATA: Right pelvic pain for 2 weeks. Estimated gestational
age by LMP is 5 weeks 3 days. Quantitative beta HCG is [DATE].

EXAM:
OBSTETRIC <14 WK US AND TRANSVAGINAL OB US
TECHNIQUE: Both transabdominal and transvaginal ultrasound examinations were
performed for complete evaluation of the gestation as well as the
maternal uterus, adnexal regions, and pelvic cul-de-sac.
Transvaginal technique was performed to assess early pregnancy.

[Series 1: us ob transvaginal · 0.19mm/px · 13 of 67 slices shown]
[im 3/67]
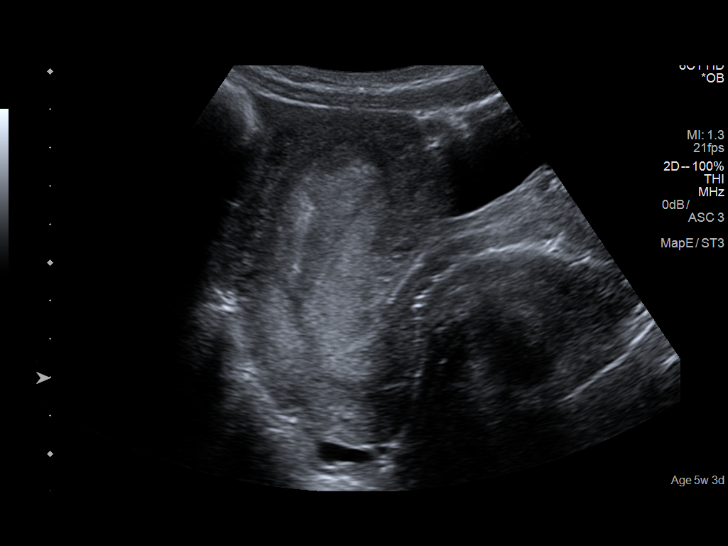
[im 8/67]
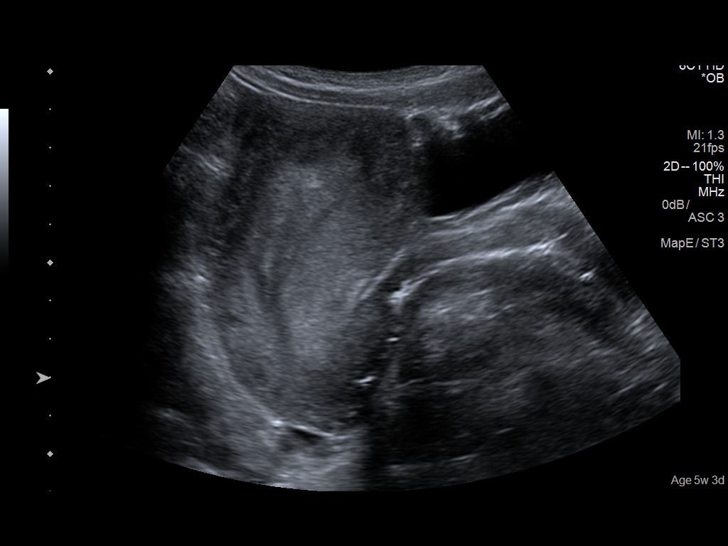
[im 13/67]
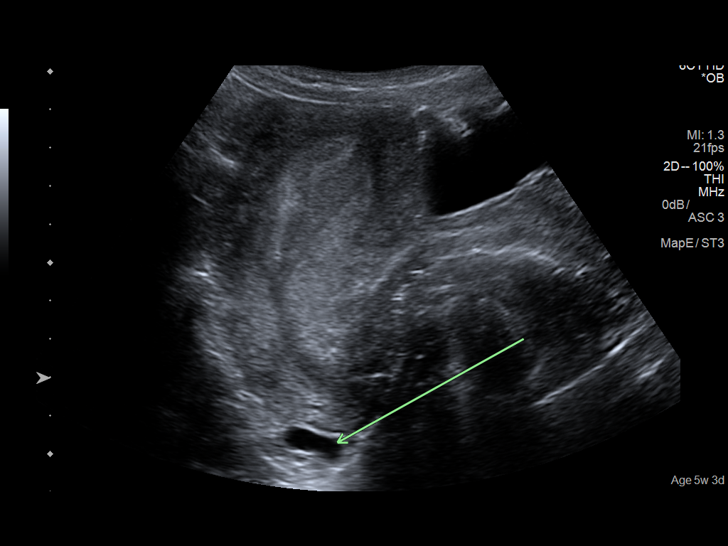
[im 18/67]
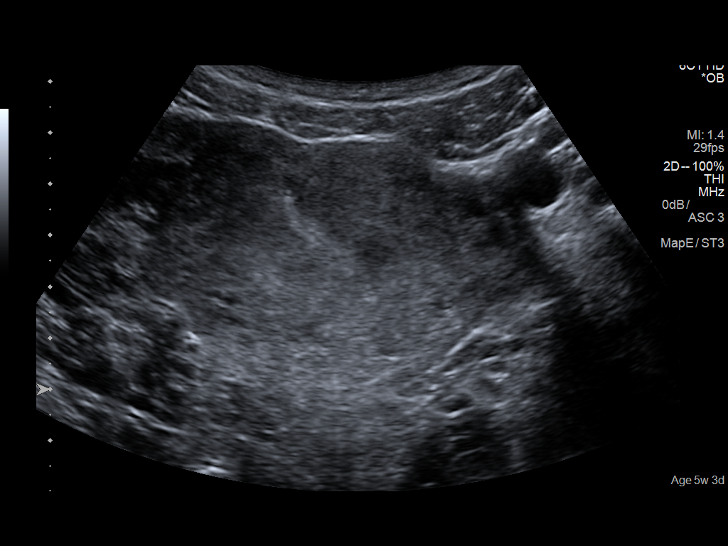
[im 23/67]
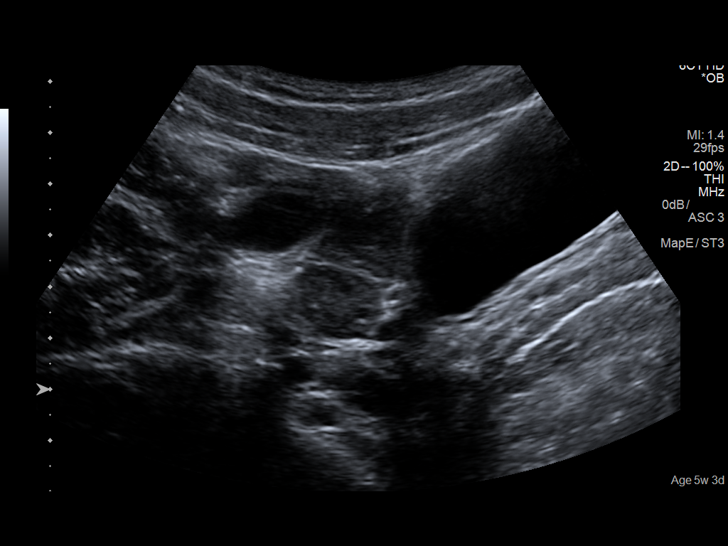
[im 27/67]
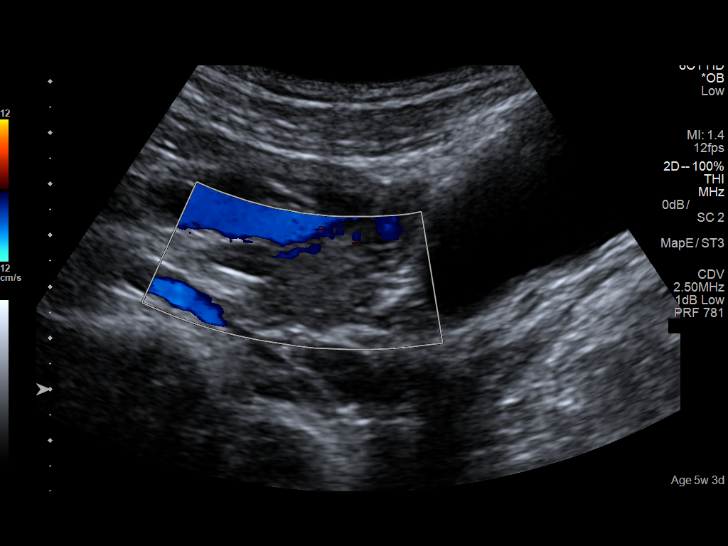
[im 35/67]
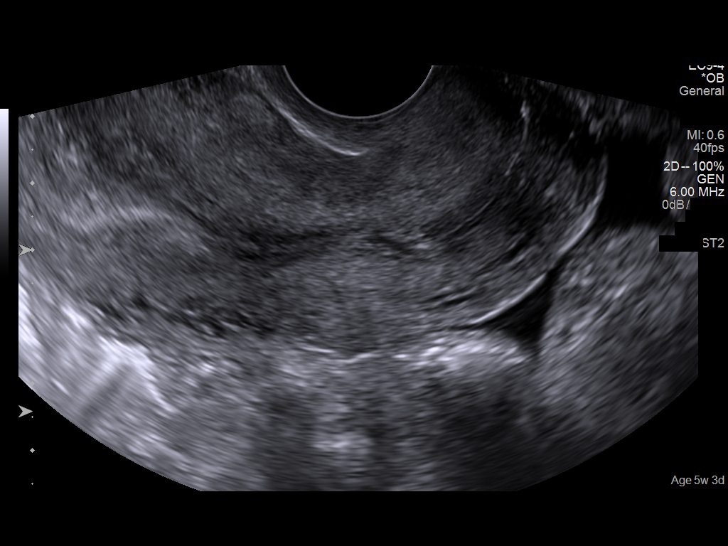
[im 40/67]
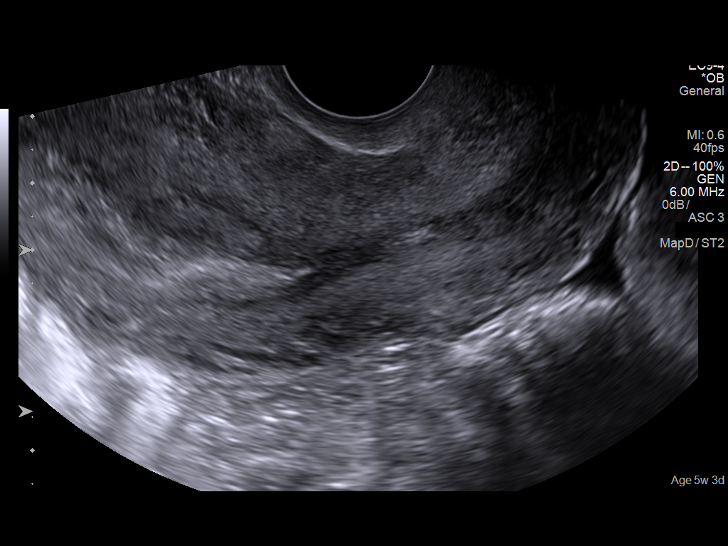
[im 45/67]
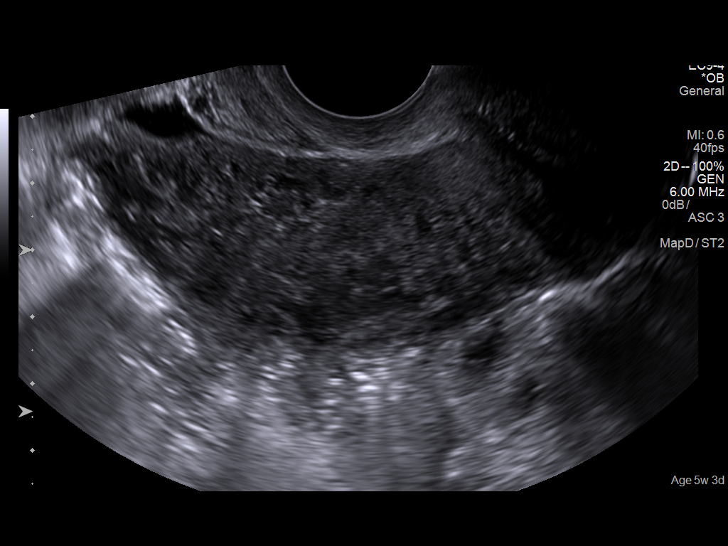
[im 49/67]
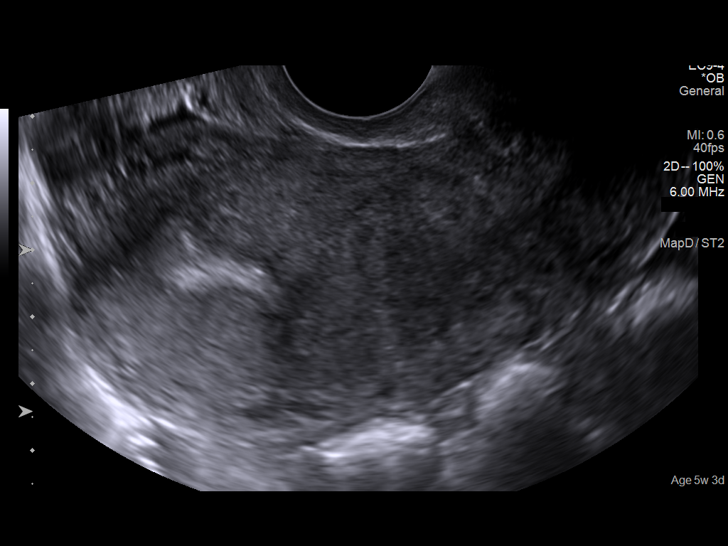
[im 54/67]
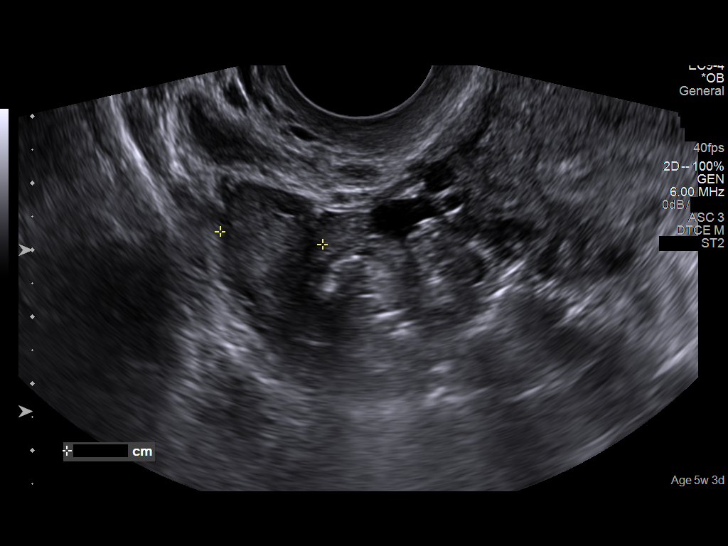
[im 59/67]
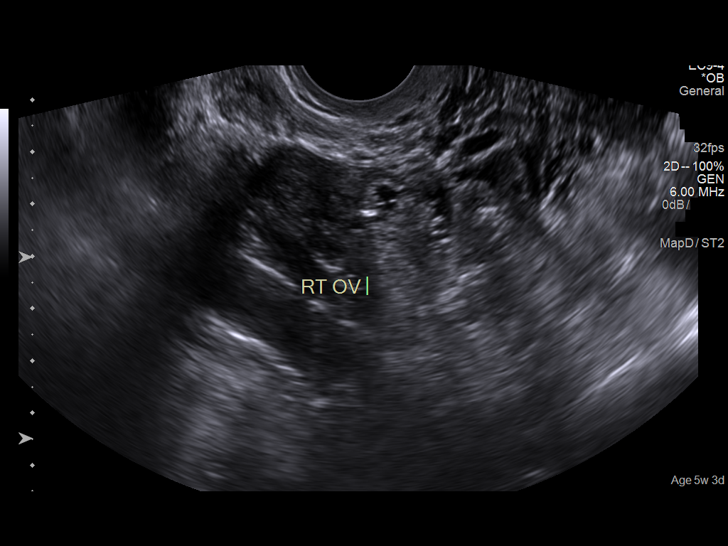
[im 64/67]
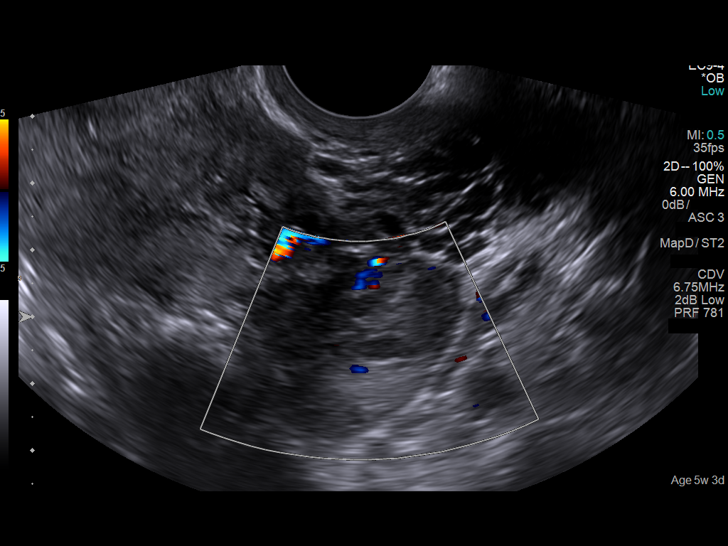

[13 of 28 positions shown; findings below may reference images not displayed]

FINDINGS: Intrauterine gestational sac: No intrauterine gestational sac
identified.

Yolk sac:  Not identified.

Embryo:  Not Visualized.

Cardiac Activity: Not Visualized.

Maternal uterus/adnexae: Uterus is anteverted. No myometrial mass
lesions identified. Endometrium is mildly thickened and somewhat
heterogeneous. No endometrial fluid collections identified. Both
ovaries are visualized and appear normal. No abnormal adnexal
masses. Flow is demonstrated on both ovaries with color flow Doppler
imaging. Small amount of free fluid in the pelvis.
IMPRESSION: No intrauterine gestational sac, yolk sac, or fetal pole identified.
Differential considerations include intrauterine pregnancy too early
to be sonographically visualized, missed abortion, or ectopic
pregnancy. Followup ultrasound is recommended in 10-14 days for
further evaluation.

## 2020-10-01 NOTE — L&D Delivery Note (Signed)
OB/GYN Faculty Practice Delivery Note  Kerri Soto is a 25 y.o. G3P1011 s/p SVD at [redacted]w[redacted]d. She was admitted for IOL for IUFD.   ROM: 0h 60m with clear fluid GBS Status: unknown Maximum Maternal Temperature: 99.5  Labor Progress: Patient was given buccal and vaginal cytotec and progressed to complete with a bulging bag.  Delivery Date/Time: 1840 on 09/21/2021 Delivery: Called to room and patient was complete and pushing. Fetus delivered en call while bag intact. Upon delivery bag ruptured and placenta found to be intact and completely delivered. Cord clamped and cut by Dr. Ephriam Jenkins. Fundus firm with massage and Pitocin. Labia, perineum, vagina, and cervix inspected and no lacerations appreciated. Fetus was wrapped in blanket and given to mom to hold per her request  Placenta: intact, to path Complications: IUFD Lacerations: None EBL: 75cc Analgesia: epidural   Infant: female   APGARs IUFD   weight pending  Warner Mccreedy, MD, MPH OB Fellow, Faculty Practice Center for [facility], [facility] 09/21/2021, 6:00 AM

## 2021-02-11 ENCOUNTER — Encounter (HOSPITAL_COMMUNITY): Payer: Self-pay | Admitting: Emergency Medicine

## 2021-02-11 ENCOUNTER — Other Ambulatory Visit: Payer: Self-pay

## 2021-02-11 ENCOUNTER — Emergency Department (HOSPITAL_COMMUNITY): Payer: Medicaid Other

## 2021-02-11 ENCOUNTER — Emergency Department (HOSPITAL_COMMUNITY)
Admission: EM | Admit: 2021-02-11 | Discharge: 2021-02-11 | Disposition: A | Discharge status: Home / Self Care | Payer: Medicaid Other | Attending: Emergency Medicine | Admitting: Emergency Medicine

## 2021-02-11 DIAGNOSIS — R112 Nausea with vomiting, unspecified: Secondary | ICD-10-CM

## 2021-02-11 DIAGNOSIS — R079 Chest pain, unspecified: Secondary | ICD-10-CM | POA: Diagnosis not present

## 2021-02-11 DIAGNOSIS — Z87891 Personal history of nicotine dependence: Secondary | ICD-10-CM | POA: Diagnosis not present

## 2021-02-11 LAB — COMPREHENSIVE METABOLIC PANEL
ALT: 20 U/L (ref 0–44)
AST: 26 U/L (ref 15–41)
Albumin: 4.7 g/dL (ref 3.5–5.0)
Alkaline Phosphatase: 57 U/L (ref 38–126)
Anion gap: 12 (ref 5–15)
BUN: 18 mg/dL (ref 6–20)
CO2: 28 mmol/L (ref 22–32)
Calcium: 9.4 mg/dL (ref 8.9–10.3)
Chloride: 98 mmol/L (ref 98–111)
Creatinine, Ser: 0.8 mg/dL (ref 0.44–1.00)
GFR, Estimated: >60 mL/min (ref >60)
Glucose, Bld: 131 mg/dL — ABNORMAL HIGH (ref 70–99)
Potassium: 2.9 mmol/L — ABNORMAL LOW (ref 3.5–5.1)
Sodium: 138 mmol/L (ref 135–145)
Total Bilirubin: 0.6 mg/dL (ref 0.3–1.2)
Total Protein: 8.5 g/dL — ABNORMAL HIGH (ref 6.5–8.1)

## 2021-02-11 LAB — LIPASE, BLOOD: Lipase: 25 U/L (ref 11–51)

## 2021-02-11 LAB — CBC WITH DIFFERENTIAL/PLATELET
Abs Immature Granulocytes: 0.04 10*3/uL (ref 0.00–0.07)
Basophils Absolute: 0 10*3/uL (ref 0.0–0.1)
Basophils Relative: 0 %
Eosinophils Absolute: 0 10*3/uL (ref 0.0–0.5)
Eosinophils Relative: 0 %
HCT: 43.4 % (ref 36.0–46.0)
Hemoglobin: 14.4 g/dL (ref 12.0–15.0)
Immature Granulocytes: 0 %
Lymphocytes Relative: 13 %
Lymphs Abs: 1.3 10*3/uL (ref 0.7–4.0)
MCH: 31.4 pg (ref 26.0–34.0)
MCHC: 33.2 g/dL (ref 30.0–36.0)
MCV: 94.6 fL (ref 80.0–100.0)
Monocytes Absolute: 0.7 10*3/uL (ref 0.1–1.0)
Monocytes Relative: 6 %
Neutro Abs: 8.6 10*3/uL — ABNORMAL HIGH (ref 1.7–7.7)
Neutrophils Relative %: 81 %
Platelets: 273 10*3/uL (ref 150–400)
RBC: 4.59 MIL/uL (ref 3.87–5.11)
RDW: 13.6 % (ref 11.5–15.5)
WBC: 10.6 10*3/uL — ABNORMAL HIGH (ref 4.0–10.5)
nRBC: 0 % (ref 0.0–0.2)

## 2021-02-11 MED ORDER — ONDANSETRON 4 MG PO TBDP: ORAL_TABLET | ORAL | 0 refills | Status: DC

## 2021-02-11 MED ORDER — POTASSIUM CHLORIDE CRYS ER 20 MEQ PO TBCR
40.0000 meq | EXTENDED_RELEASE_TABLET | Freq: Once | ORAL | Status: AC
  Administered 2021-02-11: 40 meq via ORAL
  Filled 2021-02-11: qty 2

## 2021-02-11 MED ORDER — MAGNESIUM OXIDE -MG SUPPLEMENT 400 (240 MG) MG PO TABS
800.0000 mg | ORAL_TABLET | Freq: Once | ORAL | Status: AC
  Administered 2021-02-11: 800 mg via ORAL
  Filled 2021-02-11: qty 2

## 2021-02-11 MED ORDER — ALUM & MAG HYDROXIDE-SIMETH 200-200-20 MG/5ML PO SUSP
30.0000 mL | Freq: Once | ORAL | Status: AC
  Administered 2021-02-11: 30 mL via ORAL
  Filled 2021-02-11: qty 30

## 2021-02-11 MED ORDER — ONDANSETRON 4 MG PO TBDP
4.0000 mg | ORAL_TABLET | Freq: Once | ORAL | Status: AC
  Administered 2021-02-11: 4 mg via ORAL
  Filled 2021-02-11: qty 1

## 2021-02-11 MED ORDER — DIPHENHYDRAMINE HCL 50 MG/ML IJ SOLN
25.0000 mg | Freq: Once | INTRAMUSCULAR | Status: AC
  Administered 2021-02-11: 25 mg via INTRAVENOUS
  Filled 2021-02-11: qty 1

## 2021-02-11 MED ORDER — PROCHLORPERAZINE EDISYLATE 10 MG/2ML IJ SOLN
10.0000 mg | Freq: Once | INTRAMUSCULAR | Status: AC
  Administered 2021-02-11: 10 mg via INTRAVENOUS
  Filled 2021-02-11: qty 2

## 2021-02-11 MED ORDER — SODIUM CHLORIDE 0.9 % IV BOLUS
1000.0000 mL | Freq: Once | INTRAVENOUS | Status: AC
  Administered 2021-02-11: 1000 mL via INTRAVENOUS

## 2021-02-11 NOTE — ED Provider Notes (Signed)
[facility] EMERGENCY DEPARTMENT Provider Note   CSN: [id number] Arrival date & time: 02/11/21  0034     History Chief Complaint  Patient presents with  . Hematemesis    Kerri Soto is a 25 y.o. female.  25 yo F with a chief complaints of nausea and vomiting.  Her daughter had a similar illness earlier in the week.  She however is concerned because she feels like her vomit has been streaked with red and she has been having some burning to her chest.  She looked this up online and feels like she probably ruptured her esophagus and came here for evaluation.  No fevers or chills.  No diarrhea.  Has been able to tolerate some by mouth.  The history is provided by the patient.  Illness Severity:  Moderate Onset quality:  Gradual Duration:  2 days Timing:  Constant Progression:  Worsening Chronicity:  New Associated symptoms: chest pain, nausea and vomiting   Associated symptoms: no abdominal pain, no congestion, no fever, no headaches, no myalgias, no rhinorrhea, no shortness of breath and no wheezing        Past Medical History:  Diagnosis Date  . Contraceptive management 10/22/2013  . 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 Medical history non-contributory   . Nexplanon removal 10/22/2013    Patient Active Problem List   Diagnosis Date Noted  . HSV-2 seropositive   . Trichomonal infection 09/22/2014  . Nexplanon removal 10/22/2013  . Contraceptive management 10/22/2013    Past Surgical History:  Procedure Laterality Date  . NO PAST SURGERIES       OB History    Gravida  2   Para  1   Term  1   Preterm      AB      Living  1     SAB      IAB      Ectopic      Multiple      Live Births  1           Family History  Problem Relation Age of Onset  . Diabetes Father   . Hypertension Father   . Hyperlipidemia Father   . Diabetes Maternal Grandmother   . Diabetes Paternal Grandmother     Social History   Tobacco Use  . Smoking status:  Former Smoker    Packs/day: 0.50    Years: 1.00    Pack years: 0.50    Types: Cigarettes  . Smokeless tobacco: Never Used  Substance Use Topics  . Alcohol use: No  . Drug use: No    Home Medications Prior to Admission medications   Medication Sig Start Date End Date Taking? Authorizing Provider  ondansetron (ZOFRAN ODT) 4 MG disintegrating tablet 4mg  ODT q4 hours prn nausea/vomit 02/11/21  Yes 02/13/21, DO  Prenatal Multivit-Min-Fe-FA (PRENATAL VITAMINS) 0.8 MG tablet Take 1 tablet by mouth daily. 11/08/16   01/06/17, MD  norelgestromin-ethinyl estradiol (ORTHO EVRA) 150-35 MCG/24HR transdermal patch Place 1 patch onto the skin once a week. Patient not taking: Reported on 02/08/2015 09/22/14 08/09/15  13/8/16, CNM    Allergies    Patient has no known allergies.  Review of Systems   Review of Systems  Constitutional: Negative for chills and fever.  HENT: Negative for congestion and rhinorrhea.   Eyes: Negative for redness and visual disturbance.  Respiratory: Negative for shortness of breath and wheezing.   Cardiovascular: Positive for chest pain. Negative for palpitations.  Gastrointestinal: Positive  for nausea and vomiting. Negative for abdominal pain.  Genitourinary: Negative for dysuria and urgency.  Musculoskeletal: Negative for arthralgias and myalgias.  Skin: Negative for pallor and wound.  Neurological: Negative for dizziness and headaches.    Physical Exam Updated Vital Signs BP (!) 138/98   Pulse (!) 55   Temp 98.2 F (36.8 C)   Resp 14   Ht 5\' 6"  (1.676 m)   Wt 63.5 kg   LMP 01/30/2021   SpO2 100%   Breastfeeding Unknown   BMI 22.60 kg/m   Physical Exam Vitals and nursing note reviewed.  Constitutional:      General: She is not in acute distress.    Appearance: She is well-developed. She is not diaphoretic.  HENT:     Head: Normocephalic and atraumatic.  Eyes:     Pupils: Pupils are equal, round, and reactive to light.   Cardiovascular:     Rate and Rhythm: Normal rate and regular rhythm.     Heart sounds: No murmur heard. No friction rub. No gallop.   Pulmonary:     Effort: Pulmonary effort is normal.     Breath sounds: No wheezing or rales.  Abdominal:     General: There is no distension.     Palpations: Abdomen is soft.     Tenderness: There is no abdominal tenderness.  Musculoskeletal:        General: No tenderness.     Cervical back: Normal range of motion and neck supple.  Skin:    General: Skin is warm and dry.  Neurological:     Mental Status: She is alert and oriented to person, place, and time.  Psychiatric:        Behavior: Behavior normal.     ED Results / Procedures / Treatments   Labs (all labs ordered are listed, but only abnormal results are displayed) Labs Reviewed  CBC WITH DIFFERENTIAL/PLATELET - Abnormal; Notable for the following components:      Result Value   WBC 10.6 (*)    Neutro Abs 8.6 (*)    All other components within normal limits  COMPREHENSIVE METABOLIC PANEL - Abnormal; Notable for the following components:   Potassium 2.9 (*)    Glucose, Bld 131 (*)    Total Protein 8.5 (*)    All other components within normal limits  LIPASE, BLOOD    EKG EKG Interpretation  Date/Time:  Saturday Feb 11 2021 02:18:07 EDT Ventricular Rate:  55 PR Interval:  158 QRS Duration: 94 QT Interval:  460 QTC Calculation: 440 R Axis:   86 Text Interpretation: Sinus rhythm No old tracing to compare Confirmed by [birth year] [phone]) on 02/11/2021 2:19:18 AM   Radiology DG Chest Port 1 View  Result Date: 02/11/2021 CLINICAL DATA:  Chest pain.  Post emesis.  Former smoker. EXAM: PORTABLE CHEST 1 VIEW COMPARISON:  None. FINDINGS: The heart size and mediastinal contours are within normal limits. No definite pneumomediastinum. No focal consolidation. No pulmonary edema. No pleural effusion. No pneumothorax. No acute osseous abnormality. Metallic density overlying the left neck  likely external to the patient. IMPRESSION: 1. No active disease. 2. Metallic density overlying the left neck likely external to the patient. Please correlate with physical exam. Electronically Signed   By: 02/13/2021 M.D.   On: 02/11/2021 03:08    Procedures Procedures   Medications Ordered in ED Medications  ondansetron (ZOFRAN-ODT) disintegrating tablet 4 mg (4 mg Oral Given 02/11/21 0217)  alum & mag hydroxide-simeth (MAALOX/MYLANTA) 200-200-20  MG/5ML suspension 30 mL (30 mLs Oral Given 02/11/21 0217)  sodium chloride 0.9 % bolus 1,000 mL (0 mLs Intravenous Stopped 02/11/21 0509)  prochlorperazine (COMPAZINE) injection 10 mg (10 mg Intravenous Given 02/11/21 0346)  diphenhydrAMINE (BENADRYL) injection 25 mg (25 mg Intravenous Given 02/11/21 0347)  potassium chloride SA (KLOR-CON) CR tablet 40 mEq (40 mEq Oral Given 02/11/21 0523)  magnesium oxide (MAG-OX) tablet 800 mg (800 mg Oral Given 02/11/21 3790)    ED Course  I have reviewed the triage vital signs and the nursing notes.  Pertinent labs & imaging results that were available during my care of the patient were reviewed by me and considered in my medical decision making (see chart for details).    MDM Rules/Calculators/A&P                          25 yo F with a chief complaints of nausea and vomiting.  Been exposed to someone else with a similar illness.  Well-appearing and nontoxic.  No acute distress.  She was worried that after looking this up online that she may have perforated her esophagus.  Seems unlikely based on her appearance.  Clear lung sounds.  Will obtain a screening chest x-ray.  Attempt to treat her symptoms.  Patient with recurrent nausea and vomiting after Zofran.  IV was started lab work was drawn and the patient was given Compazine and Benadryl with improvement.  She has a mildly low potassium at 2.9.  Mild leukocytosis of 10.6.  She was able to tolerate p.o. and was feeling better and was discharged  home.  5:30 AM:  I have discussed the diagnosis/risks/treatment options with the patient and believe the pt to be eligible for discharge home to follow-up with PCP. We also discussed returning to the ED immediately if new or worsening sx occur. We discussed the sx which are most concerning (e.g., sudden worsening pain, fever, inability to tolerate by mouth) that necessitate immediate return. Medications administered to the patient during their visit and any new prescriptions provided to the patient are listed below.  Medications given during this visit Medications  ondansetron (ZOFRAN-ODT) disintegrating tablet 4 mg (4 mg Oral Given 02/11/21 0217)  alum & mag hydroxide-simeth (MAALOX/MYLANTA) 200-200-20 MG/5ML suspension 30 mL (30 mLs Oral Given 02/11/21 0217)  sodium chloride 0.9 % bolus 1,000 mL (0 mLs Intravenous Stopped 02/11/21 0509)  prochlorperazine (COMPAZINE) injection 10 mg (10 mg Intravenous Given 02/11/21 0346)  diphenhydrAMINE (BENADRYL) injection 25 mg (25 mg Intravenous Given 02/11/21 0347)  potassium chloride SA (KLOR-CON) CR tablet 40 mEq (40 mEq Oral Given 02/11/21 0523)  magnesium oxide (MAG-OX) tablet 800 mg (800 mg Oral Given 02/11/21 0523)     The patient appears reasonably screen and/or stabilized for discharge and I doubt any other medical condition or other [facility] requiring further screening, evaluation, or treatment in the ED at this time prior to discharge.   Final Clinical Impression(s) / ED Diagnoses Final diagnoses:  Nausea and vomiting in adult    Rx / DC Orders ED Discharge Orders         Ordered    ondansetron (ZOFRAN ODT) 4 MG disintegrating tablet        02/11/21 0526           Melene Plan, DO 02/11/21 0530

## 2021-02-11 NOTE — Discharge Instructions (Signed)
Return for worsening pain, fever, inability to eat or drink 

## 2021-02-11 NOTE — ED Triage Notes (Signed)
Pt states she is vomiting blood and that her chest "hurts really bad". Said she looked up her symptoms on Google and is said she could have a "perforated esophagus".

## 2021-02-14 ENCOUNTER — Other Ambulatory Visit: Payer: Self-pay

## 2021-02-14 ENCOUNTER — Emergency Department
Admission: EM | Admit: 2021-02-14 | Discharge: 2021-02-14 | Disposition: A | Discharge status: Home / Self Care | Payer: Medicaid Other | Attending: Emergency Medicine | Admitting: Emergency Medicine

## 2021-02-14 ENCOUNTER — Encounter: Payer: Self-pay | Admitting: Medical Oncology

## 2021-02-14 DIAGNOSIS — R112 Nausea with vomiting, unspecified: Secondary | ICD-10-CM | POA: Diagnosis not present

## 2021-02-14 DIAGNOSIS — Z87891 Personal history of nicotine dependence: Secondary | ICD-10-CM | POA: Diagnosis not present

## 2021-02-14 LAB — CBC WITH DIFFERENTIAL/PLATELET
Abs Immature Granulocytes: 0.04 10*3/uL (ref 0.00–0.07)
Basophils Absolute: 0.1 10*3/uL (ref 0.0–0.1)
Basophils Relative: 1 %
Eosinophils Absolute: 0.1 10*3/uL (ref 0.0–0.5)
Eosinophils Relative: 1 %
HCT: 46.9 % — ABNORMAL HIGH (ref 36.0–46.0)
Hemoglobin: 15.7 g/dL — ABNORMAL HIGH (ref 12.0–15.0)
Immature Granulocytes: 0 %
Lymphocytes Relative: 30 %
Lymphs Abs: 2.9 10*3/uL (ref 0.7–4.0)
MCH: 31.2 pg (ref 26.0–34.0)
MCHC: 33.5 g/dL (ref 30.0–36.0)
MCV: 93.1 fL (ref 80.0–100.0)
Monocytes Absolute: 0.8 10*3/uL (ref 0.1–1.0)
Monocytes Relative: 9 %
Neutro Abs: 5.9 10*3/uL (ref 1.7–7.7)
Neutrophils Relative %: 59 %
Platelets: 260 10*3/uL (ref 150–400)
RBC: 5.04 MIL/uL (ref 3.87–5.11)
RDW: 13.2 % (ref 11.5–15.5)
WBC: 9.9 10*3/uL (ref 4.0–10.5)
nRBC: 0 % (ref 0.0–0.2)

## 2021-02-14 LAB — URINALYSIS, COMPLETE (UACMP) WITH MICROSCOPIC
Bacteria, UA: NONE SEEN
Bilirubin Urine: NEGATIVE
Glucose, UA: NEGATIVE mg/dL
Ketones, ur: 80 mg/dL — AB
Leukocytes,Ua: NEGATIVE
Nitrite: NEGATIVE
Protein, ur: 100 mg/dL — AB
Specific Gravity, Urine: 1.036 — ABNORMAL HIGH (ref 1.005–1.030)
pH: 6 (ref 5.0–8.0)

## 2021-02-14 LAB — PREGNANCY, URINE: Preg Test, Ur: NEGATIVE

## 2021-02-14 LAB — BASIC METABOLIC PANEL
Anion gap: 14 (ref 5–15)
BUN: 20 mg/dL (ref 6–20)
CO2: 24 mmol/L (ref 22–32)
Calcium: 9.3 mg/dL (ref 8.9–10.3)
Chloride: 97 mmol/L — ABNORMAL LOW (ref 98–111)
Creatinine, Ser: 0.87 mg/dL (ref 0.44–1.00)
GFR, Estimated: >60 mL/min (ref >60)
Glucose, Bld: 79 mg/dL (ref 70–99)
Potassium: 3.2 mmol/L — ABNORMAL LOW (ref 3.5–5.1)
Sodium: 135 mmol/L (ref 135–145)

## 2021-02-14 LAB — HEPATIC FUNCTION PANEL
ALT: 16 U/L (ref 0–44)
AST: 21 U/L (ref 15–41)
Albumin: 4.8 g/dL (ref 3.5–5.0)
Alkaline Phosphatase: 61 U/L (ref 38–126)
Bilirubin, Direct: 0.2 mg/dL (ref 0.0–0.2)
Indirect Bilirubin: 1.1 mg/dL — ABNORMAL HIGH (ref 0.3–0.9)
Total Bilirubin: 1.3 mg/dL — ABNORMAL HIGH (ref 0.3–1.2)
Total Protein: 8.4 g/dL — ABNORMAL HIGH (ref 6.5–8.1)

## 2021-02-14 LAB — LIPASE, BLOOD: Lipase: 29 U/L (ref 11–51)

## 2021-02-14 MED ORDER — SODIUM CHLORIDE 0.9 % IV BOLUS
1000.0000 mL | Freq: Once | INTRAVENOUS | Status: AC
  Administered 2021-02-14: 1000 mL via INTRAVENOUS

## 2021-02-14 MED ORDER — FAMOTIDINE 20 MG PO TABS: 20.0000 mg | ORAL_TABLET | Freq: Two times a day (BID) | ORAL | 0 refills | Status: DC

## 2021-02-14 MED ORDER — METOCLOPRAMIDE HCL 10 MG PO TABS: 10.0000 mg | ORAL_TABLET | Freq: Three times a day (TID) | ORAL | 0 refills | Status: DC | PRN

## 2021-02-14 MED ORDER — ONDANSETRON HCL 4 MG/2ML IJ SOLN
4.0000 mg | Freq: Once | INTRAMUSCULAR | Status: AC
  Administered 2021-02-14: 4 mg via INTRAVENOUS
  Filled 2021-02-14: qty 2

## 2021-02-14 NOTE — ED Notes (Signed)
Pt has been provided with discharge instructions. Pt denies any questions or concerns at this time. Pt verbalizes understanding for follow up care and d/c.  VSS.  Pt left department with all belongings.  

## 2021-02-14 NOTE — ED Triage Notes (Signed)
Pt reports that she has been vomiting since last Thursday, states that she went to [employer] and her potassium was low so she was given K tabs. Pt denies pain, states that she is just unable to keep anything solid down.

## 2021-02-14 NOTE — ED Provider Notes (Signed)
[facility] Emergency Department Provider Note ____________________________________________   Event Date/Time   First MD Initiated Contact with Patient 02/14/21 1033     (approximate)  I have reviewed the triage vital signs and the nursing notes.   HISTORY  Chief Complaint No chief complaint on file.    HPI MARIGNY BORRE is a 25 y.o. female with PMH as noted below who presents with persistent nausea and vomiting over the last 5 days, around 5-6 episodes per day, nonbloody and nonbilious currently, and not associated with any abdominal pain or diarrhea.  The patient had a bowel movement earlier today which was normal.  She states that the symptoms initially started after her daughter had similar GI symptoms, however her daughter symptoms have resolved.  The patient was seen in the ED at [facility] several days ago for the same symptoms after she also developed chest pain and had an episode of blood-tinged emesis.  However, since that time there has been no further blood.  She was noted to have a low potassium.   Past Medical History:  Diagnosis Date  . Contraceptive management 10/22/2013  . 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 Medical history non-contributory   . Nexplanon removal 10/22/2013    Patient Active Problem List   Diagnosis Date Noted  . HSV-2 seropositive   . Trichomonal infection 09/22/2014  . Nexplanon removal 10/22/2013  . Contraceptive management 10/22/2013    Past Surgical History:  Procedure Laterality Date  . NO PAST SURGERIES      Prior to Admission medications   Medication Sig Start Date End Date Taking? Authorizing Provider  famotidine (PEPCID) 20 MG tablet Take 1 tablet (20 mg total) by mouth 2 (two) times daily for 15 days. 02/14/21 03/01/21 Yes Dionne Bucy, MD  metoCLOPramide (REGLAN) 10 MG tablet Take 1 tablet (10 mg total) by mouth every 8 (eight) hours as needed for nausea or vomiting. 02/14/21 02/14/22 Yes Dionne Bucy, MD  ondansetron (ZOFRAN ODT) 4 MG disintegrating tablet 4mg  ODT q4 hours prn nausea/vomit 02/11/21   02/13/21, DO  Prenatal Multivit-Min-Fe-FA (PRENATAL VITAMINS) 0.8 MG tablet Take 1 tablet by mouth daily. 11/08/16   01/06/17, MD  norelgestromin-ethinyl estradiol (ORTHO EVRA) 150-35 MCG/24HR transdermal patch Place 1 patch onto the skin once a week. Patient not taking: Reported on 02/08/2015 09/22/14 08/09/15  13/8/16, CNM    Allergies Patient has no known allergies.  Family History  Problem Relation Age of Onset  . Diabetes Father   . Hypertension Father   . Hyperlipidemia Father   . Diabetes Maternal Grandmother   . Diabetes Paternal Grandmother     Social History Social History   Tobacco Use  . Smoking status: Former Smoker    Packs/day: 0.50    Years: 1.00    Pack years: 0.50    Types: Cigarettes  . Smokeless tobacco: Never Used  Substance Use Topics  . Alcohol use: No  . Drug use: No    Review of Systems  Constitutional: No fever/chills. Eyes: No redness. ENT: No sore throat. Cardiovascular: Denies chest pain. Respiratory: Denies shortness of breath. Gastrointestinal: Positive for nausea and vomiting.  No abdominal pain or diarrhea. Genitourinary: Negative for dysuria, frequency, vaginal bleeding.  Musculoskeletal: Negative for back pain. Skin: Negative for rash. Neurological: Negative for headache.   ____________________________________________   PHYSICAL EXAM:  VITAL SIGNS: ED Triage Vitals  Enc Vitals Group     BP 02/14/21 1018 117/85     Pulse Rate  02/14/21 1018 68     Resp 02/14/21 1018 16     Temp 02/14/21 1018 98.2 F (36.8 C)     Temp Source 02/14/21 1018 Oral     SpO2 02/14/21 1018 99 %     Weight 02/14/21 1019 135 lb (61.2 kg)     Height 02/14/21 1019 5\' 7"  (1.702 m)     Head Circumference --      Peak Flow --      Pain Score 02/14/21 1018 0     Pain Loc --      Pain Edu? --      Excl. in GC? --      Constitutional: Alert and oriented. Well appearing and in no acute distress. Eyes: Conjunctivae are normal.  No scleral icterus. Head: Atraumatic. Nose: No congestion/rhinnorhea. Mouth/Throat: Mucous membranes are moist.   Neck: Normal range of motion.  Cardiovascular: Normal rate, regular rhythm. Good peripheral circulation. Respiratory: Normal respiratory effort.  No retractions.  Gastrointestinal: Soft and nontender. No distention.  Genitourinary: No flank tenderness. Musculoskeletal: Extremities warm and well perfused.  Neurologic:  Normal speech and language. No gross focal neurologic deficits are appreciated.  Skin:  Skin is warm and dry. No rash noted. Psychiatric: Mood and affect are normal. Speech and behavior are normal.  ____________________________________________   LABS (all labs ordered are listed, but only abnormal results are displayed)  Labs Reviewed  BASIC METABOLIC PANEL - Abnormal; Notable for the following components:      Result Value   Potassium 3.2 (*)    Chloride 97 (*)    All other components within normal limits  HEPATIC FUNCTION PANEL - Abnormal; Notable for the following components:   Total Protein 8.4 (*)    Total Bilirubin 1.3 (*)    Indirect Bilirubin 1.1 (*)    All other components within normal limits  CBC WITH DIFFERENTIAL/PLATELET - Abnormal; Notable for the following components:   Hemoglobin 15.7 (*)    HCT 46.9 (*)    All other components within normal limits  URINALYSIS, COMPLETE (UACMP) WITH MICROSCOPIC - Abnormal; Notable for the following components:   Color, Urine YELLOW (*)    APPearance HAZY (*)    Specific Gravity, Urine 1.036 (*)    Hgb urine dipstick SMALL (*)    Ketones, ur 80 (*)    Protein, ur 100 (*)    All other components within normal limits  LIPASE, BLOOD  PREGNANCY, URINE  POC URINE PREG, ED    ____________________________________________  EKG   ____________________________________________  RADIOLOGY    ____________________________________________   PROCEDURES  Procedure(s) performed: No  Procedures  Critical Care performed: No ____________________________________________   INITIAL IMPRESSION / ASSESSMENT AND PLAN / ED COURSE  Pertinent labs & imaging results that were available during my care of the patient were reviewed by me and considered in my medical decision making (see chart for details).  25 year old female with PMH as noted above presents with persistent nausea and vomiting over the last 5 days.  She has no associated abdominal pain.  I reviewed the past medical records in epic.  The patient was seen in the ED at [facility] on 5/14.  She had a negative chest x-ray and reassuring labs.  Her potassium was 2.9.  On exam today, the patient is overall well-appearing.  Her vital signs are normal.  The physical exam is unremarkable.  The abdomen is soft and nontender.  Differential includes gastritis, PUD, gastroenteritis, pancreatitis, other hepatobiliary cause.  I have  a low suspicion for other intra-abdominal etiology given the lack of any abdominal pain or tenderness.  There is no indication for imaging at this time.  We will obtain labs, give fluids, and reassess.  ----------------------------------------- 1:26 PM on 02/14/2021 -----------------------------------------  Lab work-up is reassuring.  The patient's bilirubin is minimally elevated, however she has no jaundice, right upper quadrant pain or tenderness, or any other evidence of acute biliary obstruction.  She does have some ketones in the urine consistent with vomiting and dehydration.    The patient endorses relatively heavy alcohol use.  She states that she binges on alcohol a few times per week but does not drink every day.  Her last heavy alcohol use was 3 days ago.  This certainly could  be contributing to the patient's symptoms.  Overall I suspect most likely gastritis.  The patient is now tolerating p.o.  Given her reassuring lab work-up and well appearance, she is appropriate for discharge home.  I will prescribe Pepcid and Reglan.  Return precautions given, and she expresses understanding.  ____________________________________________   FINAL CLINICAL IMPRESSION(S) / ED DIAGNOSES  Final diagnoses:  Nausea and vomiting, intractability of vomiting not specified, unspecified vomiting type      NEW MEDICATIONS STARTED DURING THIS VISIT:  New Prescriptions   FAMOTIDINE (PEPCID) 20 MG TABLET    Take 1 tablet (20 mg total) by mouth 2 (two) times daily for 15 days.   METOCLOPRAMIDE (REGLAN) 10 MG TABLET    Take 1 tablet (10 mg total) by mouth every 8 (eight) hours as needed for nausea or vomiting.     Note:  This document was prepared using Dragon voice recognition software and may include unintentional dictation errors.    Dionne Bucy, MD 02/14/21 1328

## 2021-02-14 NOTE — Discharge Instructions (Addendum)
The Pepcid twice daily for the next few weeks, and the Reglan as needed for nausea.  Avoid alcohol use.  Follow-up with your regular doctor within the next several weeks.  Return to the ER for new, worsening, or persistent severe vomiting, blood in the vomit, abdominal pain, fever, weakness, or any other new or worsening symptoms that concern you.  Your blood work here shows that your bilirubin (which is one of the liver tests) was slightly elevated at 1.5.  This should be rechecked when you follow-up with your doctor.

## 2021-02-14 NOTE — ED Notes (Signed)
Pt given food for PO trial.

## 2021-03-28 ENCOUNTER — Ambulatory Visit (INDEPENDENT_AMBULATORY_CARE_PROVIDER_SITE_OTHER): Payer: Medicaid Other

## 2021-03-28 ENCOUNTER — Other Ambulatory Visit: Payer: Self-pay

## 2021-03-28 VITALS — BP 116/75 | HR 85 | Ht 66.0 in | Wt 170.4 lb

## 2021-03-28 DIAGNOSIS — Z3201 Encounter for pregnancy test, result positive: Secondary | ICD-10-CM

## 2021-03-28 DIAGNOSIS — Z3200 Encounter for pregnancy test, result unknown: Secondary | ICD-10-CM

## 2021-03-28 LAB — POCT URINE PREGNANCY: Preg Test, Ur: POSITIVE — AB

## 2021-03-28 NOTE — Progress Notes (Addendum)
   NURSE VISIT- PREGNANCY CONFIRMATION   SUBJECTIVE:  Kerri Soto is a 25 y.o. G35P1001 female at [redacted]w[redacted]d by uncertain LMP of approximately 03/03/21. Patient's last menstrual period was 03/03/2021 (approximate). Here for pregnancy confirmation.  Home pregnancy test: positive x 1   She reports no complaints.  She is not taking prenatal vitamins.    OBJECTIVE:  BP 116/75 (BP Location: Right Arm, Patient Position: Sitting, Cuff Size: Normal)   Pulse 85   Ht 5\' 6"  (1.676 m)   Wt 170 lb 6.4 oz (77.3 kg)   LMP 03/03/2021 (Approximate)   Breastfeeding No   BMI 27.50 kg/m   Appears well, in no apparent distress  Results for orders placed or performed in visit on 03/28/21 (from the past 24 hour(s))  POCT urine pregnancy   Collection Time: 03/28/21 10:43 AM  Result Value Ref Range   Preg Test, Ur Positive (A) Negative    ASSESSMENT: Positive pregnancy test, [redacted]w[redacted]d by LMP    PLAN: Schedule for dating ultrasound in 5-6 weeks Prenatal vitamins: plans to begin OTC ASAP   Nausea medicines: not currently needed   OB packet given: Yes  Jilberto Vanderwall A Olene Godfrey  03/28/2021 10:47 AM   Chart reviewed for nurse visit. Agree with plan of care.  03/30/2021, Cheral Marker 03/28/2021 11:50 AM

## 2021-03-29 ENCOUNTER — Encounter: Payer: Self-pay | Admitting: Nurse Practitioner

## 2021-03-29 ENCOUNTER — Telehealth: Payer: Medicaid Other | Admitting: Nurse Practitioner

## 2021-03-29 DIAGNOSIS — Z3201 Encounter for pregnancy test, result positive: Secondary | ICD-10-CM | POA: Diagnosis not present

## 2021-03-29 LAB — BETA HCG QUANT (REF LAB): hCG Quant: 45 m[IU]/mL

## 2021-03-29 NOTE — Progress Notes (Signed)
Virtual Visit Consent   [name], you are scheduled for a virtual visit with Kerri Daphine Deutscher, FNP, a [facility] provider, today.     Just as with appointments in the office, your consent must be obtained to participate.  Your consent will be active for this visit and any virtual visit you may have with one of our providers in the next 365 days.     If you have a MyChart account, a copy of this consent can be sent to you electronically.  All virtual visits are billed to your insurance company just like a traditional visit in the office.    As this is a virtual visit, video technology does not allow for your provider to perform a traditional examination.  This may limit your provider's ability to fully assess your condition.  If your provider identifies any concerns that need to be evaluated in person or the need to arrange testing (such as labs, EKG, etc.), we will make arrangements to do so.     Although advances in technology are sophisticated, we cannot ensure that it will always work on either your end or our end.  If the connection with a video visit is poor, the visit may have to be switched to a telephone visit.  With either a video or telephone visit, we are not always able to ensure that we have a secure connection.     I need to obtain your verbal consent now.   Are you willing to proceed with your visit today? YES   OTIS BURRESS has provided verbal consent on 03/29/2021 for a virtual visit (video or telephone).   Kerri Daphine Deutscher, FNP   Date: 03/29/2021 12:20 PM   Virtual Visit via Video Note   I, Kerri Soto, connected with Kerri Soto ([id number], 09-Nov-1995) on 03/29/21 at 12:15 PM EDT by a video-enabled telemedicine application and verified that I am speaking with the correct person using two identifiers.  Location: Patient: Virtual Visit Location Patient: Home Provider: Virtual Visit Location Provider: Mobile   I discussed the  limitations of evaluation and management by telemedicine and the availability of in person appointments. The patient expressed understanding and agreed to proceed.    History of Present Illness: Kerri Soto is a 25 y.o. who identifies as a female who was assigned female at birth, and is being seen today for positive pregnancy test.  HPI: HPI   Patient states she had blood work done at her doctors office and the results show she is pregnant. She said they made her an appointment for sometime in August. She wanted to know why they are waiting to see her in [place], and not sooner. She said she was not having any abdominal pain, bleeding or spotting. No nausea or vomiting. LMP 03/04/21. Problems:  Patient Active Problem List   Diagnosis Date Noted   HSV-2 seropositive    Trichomonal infection 09/22/2014   Nexplanon removal 10/22/2013   Contraceptive management 10/22/2013    Allergies: No Known Allergies Medications: No current outpatient medications on file.  Observations/Objective: Patient is well-developed, well-nourished in no acute distress.  Resting comfortably at home.  Head is normocephalic, atraumatic.  No labored breathing. Speech is clear and coherent with logical content.  Patient is alert and oriented at baseline.  Appears nervous. Is pacing back and forth  Assessment and Plan:  Positive pregnancy  Can get prenatal vitamins OTC and take until seen by doctor NTBS if develops cramping or any signs  of bleeding. EDD March 11,2023  Follow Up Instructions: I discussed the assessment and treatment plan with the patient. The patient was provided an opportunity to ask questions and all were answered. The patient agreed with the plan and demonstrated an understanding of the instructions.  A copy of instructions were sent to the patient via MyChart.  The patient was advised to call back or seek an in-person evaluation if the symptoms worsen or if the condition fails to improve  as anticipated.  Time:  I spent 10 minutes with the patient via telehealth technology discussing the above problems/concerns.    Kerri Daphine Deutscher, FNP

## 2021-05-02 ENCOUNTER — Other Ambulatory Visit: Payer: Self-pay | Admitting: Obstetrics & Gynecology

## 2021-05-02 DIAGNOSIS — O3680X0 Pregnancy with inconclusive fetal viability, not applicable or unspecified: Secondary | ICD-10-CM

## 2021-05-03 ENCOUNTER — Ambulatory Visit (INDEPENDENT_AMBULATORY_CARE_PROVIDER_SITE_OTHER): Payer: Medicaid Other

## 2021-05-03 ENCOUNTER — Other Ambulatory Visit: Payer: Self-pay

## 2021-05-03 DIAGNOSIS — Z3A08 8 weeks gestation of pregnancy: Secondary | ICD-10-CM | POA: Diagnosis not present

## 2021-05-03 DIAGNOSIS — O3680X0 Pregnancy with inconclusive fetal viability, not applicable or unspecified: Secondary | ICD-10-CM

## 2021-05-03 NOTE — Progress Notes (Signed)
[place] 8+4 wks,single IUP with YS,CRL 20.70 mm,fhr 171 bpm,normal ovaries,multiple fibroids,(#1) fundal right intramural fibroid 2.5 x 2 x 2.4 cm,(#2) anterior mid left intramural fibroid 2.4 x 1.9 x 1.8 cm

## 2021-05-29 ENCOUNTER — Other Ambulatory Visit: Payer: Self-pay | Admitting: Obstetrics & Gynecology

## 2021-05-29 DIAGNOSIS — Z3682 Encounter for antenatal screening for nuchal translucency: Secondary | ICD-10-CM

## 2021-05-30 ENCOUNTER — Encounter: Payer: Self-pay | Admitting: Women's Health

## 2021-05-30 ENCOUNTER — Ambulatory Visit (INDEPENDENT_AMBULATORY_CARE_PROVIDER_SITE_OTHER): Payer: Medicaid Other

## 2021-05-30 ENCOUNTER — Other Ambulatory Visit: Payer: Self-pay

## 2021-05-30 ENCOUNTER — Ambulatory Visit (INDEPENDENT_AMBULATORY_CARE_PROVIDER_SITE_OTHER): Payer: Medicaid Other | Admitting: Women's Health

## 2021-05-30 ENCOUNTER — Ambulatory Visit: Payer: Medicaid Other | Admitting: *Deleted

## 2021-05-30 VITALS — BP 113/74 | HR 93 | Wt 160.0 lb

## 2021-05-30 DIAGNOSIS — Z6791 Unspecified blood type, Rh negative: Secondary | ICD-10-CM

## 2021-05-30 DIAGNOSIS — O26899 Other specified pregnancy related conditions, unspecified trimester: Secondary | ICD-10-CM

## 2021-05-30 DIAGNOSIS — Z3A12 12 weeks gestation of pregnancy: Secondary | ICD-10-CM

## 2021-05-30 DIAGNOSIS — Z3480 Encounter for supervision of other normal pregnancy, unspecified trimester: Secondary | ICD-10-CM

## 2021-05-30 DIAGNOSIS — Z3490 Encounter for supervision of normal pregnancy, unspecified, unspecified trimester: Secondary | ICD-10-CM | POA: Insufficient documentation

## 2021-05-30 DIAGNOSIS — Z1379 Encounter for other screening for genetic and chromosomal anomalies: Secondary | ICD-10-CM

## 2021-05-30 DIAGNOSIS — Z3481 Encounter for supervision of other normal pregnancy, first trimester: Secondary | ICD-10-CM

## 2021-05-30 DIAGNOSIS — Z3682 Encounter for antenatal screening for nuchal translucency: Secondary | ICD-10-CM

## 2021-05-30 MED ORDER — BLOOD PRESSURE MONITOR MISC: 0 refills | Status: DC

## 2021-05-30 NOTE — Progress Notes (Signed)
INITIAL OBSTETRICAL VISIT Patient name: Kerri Soto MRN [id number]  Date of birth: Sep 09, 1996 Chief Complaint:   Initial Prenatal Visit  History of Present Illness:   Kerri Soto is a 25 y.o. G29P1011 African-American female at [redacted]w[redacted]d by LMP c/w u/s at 8 weeks with an Estimated Date of Delivery: 12/09/21 being seen today for her initial obstetrical visit.   Patient's last menstrual period was 03/04/2021 (exact date). Her obstetrical history is significant for  term uncomplicated svb, SAB x 1 .   Today she reports nausea and headaches Last pap pt thought she had one here-do not see in EPIC. Results were: N/A. Declines today, wants to do next visit.  Depression screen PHQ 2/9 05/30/2021  Decreased Interest 1  Down, Depressed, Hopeless 1  PHQ - 2 Score 2  Altered sleeping 1  Tired, decreased energy 1  Change in appetite 1  Feeling bad or failure about yourself  0  Trouble concentrating 0  Moving slowly or fidgety/restless 0  Suicidal thoughts 0  PHQ-9 Score 5     GAD 7 : Generalized Anxiety Score 05/30/2021  Nervous, Anxious, on Edge 0  Control/stop worrying 1  Worry too much - different things 1  Trouble relaxing 0  Restless 1  Easily annoyed or irritable 1  Afraid - awful might happen 0  Total GAD 7 Score 4     Review of Systems:   Pertinent items are noted in HPI Denies cramping/contractions, leakage of fluid, vaginal bleeding, abnormal vaginal discharge w/ itching/odor/irritation, headaches, visual changes, shortness of breath, chest pain, abdominal pain, severe nausea/vomiting, or problems with urination or bowel movements unless otherwise stated above.  Pertinent History Reviewed:  Reviewed past medical,surgical, social, obstetrical and family history.  Reviewed problem list, medications and allergies. OB History  Gravida Para Term Preterm AB Living  3 1 1   1 1   SAB IAB Ectopic Multiple Live Births  1       1    # Outcome Date GA Lbr Len/2nd Weight  Sex Delivery Anes PTL Lv  3 Current           2 SAB 11/14/16          1 Term 04/19/13 [redacted]w[redacted]d 05:37 / 00:33 6 lb 12.3 oz (3.07 kg) F Vag-Spont EPI  LIV     Birth Comments: WNL   Physical Assessment:   Vitals:   05/30/21 1054  BP: 113/74  Pulse: 93  Weight: 160 lb (72.6 kg)  Body mass index is 25.82 kg/m.       Physical Examination:  General appearance - well appearing, and in no distress  Mental status - alert, oriented to person, place, and time  Psych:  She has a normal mood and affect  Skin - warm and dry, normal color, no suspicious lesions noted  Chest - effort normal, all lung fields clear to auscultation bilaterally  Heart - normal rate and regular rhythm  Abdomen - soft, nontender  Extremities:  No swelling or varicosities noted  Thin prep pap is not done - declines, wants to do next visit  Chaperone: N/A    TODAY'S NT 06/01/21 12+3 wks,measurements c/w dates,crl 64.24 mm,fhr 164 bpm,normal ovaries,posterior placenta,NT 1.3 mm,NB present  No results found for this or any previous visit (from the past 24 hour(s)).  Assessment & Plan:  1) Low-Risk Pregnancy G3P1011 at [redacted]w[redacted]d with an Estimated Date of Delivery: 12/09/21   2) Initial OB visit  3) Nausea> declines meds  4) Headaches> gave printed prevention/relief measures   Meds:  Meds ordered this encounter  Medications   Blood Pressure Monitor MISC    Sig: For regular home bp monitoring during pregnancy    Dispense:  1 each    Refill:  0    Please mail to patient    Initial labs obtained Continue prenatal vitamins Reviewed n/v relief measures and warning s/s to report Reviewed recommended weight gain based on pre-gravid BMI Encouraged well-balanced diet Genetic & carrier screening discussed: requests Panorama, NT/IT, and Horizon  Ultrasound discussed; fetal survey: requested CCNC completed> form faxed if has or is planning to apply for medicaid The nature of [employer] for [employer] with  multiple MDs and other Advanced Practice Providers was explained to patient; also emphasized that fellows, residents, and students are part of our team. Does not have home bp cuff. Office bp cuff given: no. Rx sent: yes. Check bp weekly, let us know if consistently >140/90.   Follow-up: Return in about 4 weeks (around 06/27/2021) for LROB w/ pap, 2nd IT, CNM, in person.   Orders Placed This Encounter  Procedures   Urine Culture   CBC/D/Plt+RPR+Rh+ABO+RubIgG...   Pain Management Screening Profile (10S)   Integrated 1   POC Urinalysis Dipstick OB    Cheral Marker CNM, [facility] 05/30/2021 11:40 AM

## 2021-05-30 NOTE — Patient Instructions (Addendum)
Kerri Soto, thank you for choosing our office today! We appreciate the opportunity to meet your healthcare needs. You may receive a short survey by mail, e-mail, or through [employer]. If you are happy with your care we would appreciate if you could take just a few minutes to complete the survey questions. We read all of your comments and take your feedback very seriously. Thank you again for choosing our office.  Center for [employer] [facility] Team at [facility]  [facility] at [facility] ([address] [place], [place] [address]) Entrance C, located off of [address] Free 24/7 valet parking   For Headaches:  Stay well hydrated, drink enough water so that your urine is clear, sometimes if you are dehydrated you can get headaches Eat small frequent meals and snacks, sometimes if you are hungry you can get headaches Sometimes you get headaches during pregnancy from the pregnancy hormones You can try tylenol (1-2 regular strength 325mg  or 1-2 extra strength 500mg ) as directed on the box. The least amount of medication that works is best.  Cool compresses (cool wet washcloth or ice pack) to area of head that is hurting You can also try drinking a caffeinated drink to see if this will help If not helping, try below:  For Prevention of Headaches/Migraines: CoQ10 100mg  three times daily Vitamin B2 400mg  daily Magnesium Oxide 400-600mg  daily  Foods to alleviate migraines:  1) dark leafy greens 2) avocado 3) tuna 4) salmon  5) beans and legumes  Foods to avoid: 1) Excessive (or irregular timing) coffee 3) aged cheeses 4) chocolate 5) citrus fruits 6) aspartame and other artifical sweeteners 7) yeast 8) MSG (in processed foods) 9) processed and cured meats 10) nuts and certain seeds 11) chicken livers and other organ meats 12) dairy products like buttermilk, sour cream, and yogurt 13) dried fruits like dates, figs, and raisins 14) garlic 15) onions 16) potato chips 17)  pickled foods like olives and sauerkraut 18) some fresh fruits like ripe banana, papaya, red plums, raspberries, kiwi, pineapple 19) tomato-based products  Recommend to keep a migraine diary: rate daily the severity of your headache (1-10) and what foods you eat that day to help determine patterns.   If You Get a Bad Headache/Migraine: Benadryl 25mg   Magnesium Oxide 1 large Gatorade 2 extra strength Tylenol (1,000mg  total) 1 cup coffee or Coke      If this doesn't help please call @ [phone]   Nausea & Vomiting Have saltine crackers or pretzels by your bed and eat a few bites before you raise your head out of bed in the morning Eat small frequent meals throughout the day instead of large meals Drink plenty of fluids throughout the day to stay hydrated, just don't drink a lot of fluids with your meals.  This can make your stomach fill up faster making you feel sick Do not brush your teeth right after you eat Products with real ginger are good for nausea, like ginger ale and ginger hard candy Make sure it says made with real ginger! Sucking on sour candy like lemon heads is also good for nausea If your prenatal vitamins make you nauseated, take them at night so you will sleep through the nausea Sea Bands If you feel like you need medicine for the nausea & vomiting please let know If you are unable to keep any fluids or food down please let know   Constipation Drink plenty of fluid, preferably water, throughout  the day Eat foods high in fiber such as fruits, vegetables, and grains Exercise, such as walking, is a good way to keep your bowels regular Drink warm fluids, especially warm prune juice, or decaf coffee Eat a 1/2 cup of real oatmeal (not instant), 1/2 cup applesauce, and 1/2-1 cup warm prune juice every day If needed, you may take Colace (docusate sodium) stool softener once or twice a day to help keep the stool soft.  If you still are having problems with  constipation, you may take Miralax once daily as needed to help keep your bowels regular.   Home Blood Pressure Monitoring for Patients   Your provider has recommended that you check your blood pressure (BP) at least once a week at home. If you do not have a blood pressure cuff at home, one will be provided for you. Contact your provider if you have not received your monitor within 1 week.   Helpful Tips for Accurate Home Blood Pressure Checks  Don't smoke, exercise, or drink caffeine 30 minutes before checking your BP Use the restroom before checking your BP (a full bladder can raise your pressure) Relax in a comfortable upright chair Feet on the ground Left arm resting comfortably on a flat surface at the level of your heart Legs uncrossed Back supported Sit quietly and don't talk Place the cuff on your bare arm Adjust snuggly, so that only two fingertips can fit between your skin and the top of the cuff Check 2 readings separated by at least one minute Keep a log of your BP readings For a visual, please reference this diagram: http://ccnc.care/bpdiagram  Provider Name: [facility]     Phone: [phone]  Zone 1: ALL CLEAR  Continue to monitor your symptoms:  BP reading is less than 140 (top number) or less than 90 (bottom number)  No right upper stomach pain No headaches or seeing spots No feeling nauseated or throwing up No swelling in face and hands  Zone 2: CAUTION Call your doctor's office for any of the following:  BP reading is greater than 140 (top number) or greater than 90 (bottom number)  Stomach pain under your ribs in the middle or right side Headaches or seeing spots Feeling nauseated or throwing up Swelling in face and hands  Zone 3: EMERGENCY  Seek immediate medical care if you have any of the following:  BP reading is greater than160 (top number) or greater than 110 (bottom number) Severe headaches not improving with Tylenol Serious difficulty  catching your breath Any worsening symptoms from Zone 2    First Trimester of Pregnancy The first trimester of pregnancy is from week 1 until the end of week 12 (months 1 through 3). A week after a sperm fertilizes an egg, the egg will implant on the wall of the uterus. This embryo will begin to develop into a baby. Genes from you and your partner are forming the baby. The female genes determine whether the baby is a boy or a girl. At 6-8 weeks, the eyes and face are formed, and the heartbeat can be seen on ultrasound. At the end of 12 weeks, all the baby's organs are formed.  Now that you are pregnant, you will want to do everything you can to have a healthy baby. Two of the most important things are to get good prenatal care and to follow your health care provider's instructions. Prenatal care is all the medical care you receive before the baby's birth. This care  will help prevent, find, and treat any problems during the pregnancy and childbirth. BODY CHANGES Your body goes through many changes during pregnancy. The changes vary from woman to woman.  You may gain or lose a couple of pounds at first. You may feel sick to your stomach (nauseous) and throw up (vomit). If the vomiting is uncontrollable, call your health care provider. You may tire easily. You may develop headaches that can be relieved by medicines approved by your health care provider. You may urinate more often. Painful urination may mean you have a bladder infection. You may develop heartburn as a result of your pregnancy. You may develop constipation because certain hormones are causing the muscles that push waste through your intestines to slow down. You may develop hemorrhoids or swollen, bulging veins (varicose veins). Your breasts may begin to grow larger and become tender. Your nipples may stick out more, and the tissue that surrounds them (areola) may become darker. Your gums may bleed and may be sensitive to brushing and  flossing. Dark spots or blotches (chloasma, mask of pregnancy) may develop on your face. This will likely fade after the baby is born. Your menstrual periods will stop. You may have a loss of appetite. You may develop cravings for certain kinds of food. You may have changes in your emotions from day to day, such as being excited to be pregnant or being concerned that something may go wrong with the pregnancy and baby. You may have more vivid and strange dreams. You may have changes in your hair. These can include thickening of your hair, rapid growth, and changes in texture. Some women also have hair loss during or after pregnancy, or hair that feels dry or thin. Your hair will most likely return to normal after your baby is born. WHAT TO EXPECT AT YOUR PRENATAL VISITS During a routine prenatal visit: You will be weighed to make sure you and the baby are growing normally. Your blood pressure will be taken. Your abdomen will be measured to track your baby's growth. The fetal heartbeat will be listened to starting around week 10 or 12 of your pregnancy. Test results from any previous visits will be discussed. Your health care provider may ask you: How you are feeling. If you are feeling the baby move. If you have had any abnormal symptoms, such as leaking fluid, bleeding, severe headaches, or abdominal cramping. If you have any questions. Other tests that may be performed during your first trimester include: Blood tests to find your blood type and to check for the presence of any previous infections. They will also be used to check for low iron levels (anemia) and Rh antibodies. Later in the pregnancy, blood tests for diabetes will be done along with other tests if problems develop. Urine tests to check for infections, diabetes, or protein in the urine. An ultrasound to confirm the proper growth and development of the baby. An amniocentesis to check for possible genetic problems. Fetal screens  for spina bifida and Down syndrome. You may need other tests to make sure you and the baby are doing well. HOME CARE INSTRUCTIONS  Medicines Follow your health care provider's instructions regarding medicine use. Specific medicines may be either safe or unsafe to take during pregnancy. Take your prenatal vitamins as directed. If you develop constipation, try taking a stool softener if your health care provider approves. Diet Eat regular, well-balanced meals. Choose a variety of foods, such as meat or vegetable-based protein, fish, milk and low-fat  dairy products, vegetables, fruits, and whole grain breads and cereals. Your health care provider will help you determine the amount of weight gain that is right for you. Avoid raw meat and uncooked cheese. These carry germs that can cause birth defects in the baby. Eating four or five small meals rather than three large meals a day may help relieve nausea and vomiting. If you start to feel nauseous, eating a few soda crackers can be helpful. Drinking liquids between meals instead of during meals also seems to help nausea and vomiting. If you develop constipation, eat more high-fiber foods, such as fresh vegetables or fruit and whole grains. Drink enough fluids to keep your urine clear or pale yellow. Activity and Exercise Exercise only as directed by your health care provider. Exercising will help you: Control your weight. Stay in shape. Be prepared for labor and delivery. Experiencing pain or cramping in the lower abdomen or low back is a good sign that you should stop exercising. Check with your health care provider before continuing normal exercises. Try to avoid standing for long periods of time. Move your legs often if you must stand in one place for a long time. Avoid heavy lifting. Wear low-heeled shoes, and practice good posture. You may continue to have sex unless your health care provider directs you otherwise. Relief of Pain or  Discomfort Wear a good support bra for breast tenderness.   Take warm sitz baths to soothe any pain or discomfort caused by hemorrhoids. Use hemorrhoid cream if your health care provider approves.   Rest with your legs elevated if you have leg cramps or low back pain. If you develop varicose veins in your legs, wear support hose. Elevate your feet for 15 minutes, 3-4 times a day. Limit salt in your diet. Prenatal Care Schedule your prenatal visits by the twelfth week of pregnancy. They are usually scheduled monthly at first, then more often in the last 2 months before delivery. Write down your questions. Take them to your prenatal visits. Keep all your prenatal visits as directed by your health care provider. Safety Wear your seat belt at all times when driving. Make a list of emergency phone numbers, including numbers for family, friends, the hospital, and police and fire departments. General Tips Ask your health care provider for a referral to a local prenatal education class. Begin classes no later than at the beginning of month 6 of your pregnancy. Ask for help if you have counseling or nutritional needs during pregnancy. Your health care provider can offer advice or refer you to specialists for help with various needs. Do not use hot tubs, steam rooms, or saunas. Do not douche or use tampons or scented sanitary pads. Do not cross your legs for long periods of time. Avoid cat litter boxes and soil used by cats. These carry germs that can cause birth defects in the baby and possibly loss of the fetus by miscarriage or stillbirth. Avoid all smoking, herbs, alcohol, and medicines not prescribed by your health care provider. Chemicals in these affect the formation and growth of the baby. Schedule a dentist appointment. At home, brush your teeth with a soft toothbrush and be gentle when you floss. SEEK MEDICAL CARE IF:  You have dizziness. You have mild pelvic cramps, pelvic pressure, or  nagging pain in the abdominal area. You have persistent nausea, vomiting, or diarrhea. You have a bad smelling vaginal discharge. You have pain with urination. You notice increased swelling in your face, hands, legs,  or ankles. SEEK IMMEDIATE MEDICAL CARE IF:  You have a fever. You are leaking fluid from your vagina. You have spotting or bleeding from your vagina. You have severe abdominal cramping or pain. You have rapid weight gain or loss. You vomit blood or material that looks like coffee grounds. You are exposed to [place] measles and have never had them. You are exposed to fifth disease or chickenpox. You develop a severe headache. You have shortness of breath. You have any kind of trauma, such as from a fall or a car accident. Document Released: 09/11/2001 Document Revised: 02/01/2014 Document Reviewed: 07/28/2013 [facility] Patient Information 2015 [place], [place]. This information is not intended to replace advice given to you by your health care provider. Make sure you discuss any questions you have with your health care provider.

## 2021-05-30 NOTE — Progress Notes (Signed)
[place] 12+3 wks,measurements c/w dates,crl 64.24 mm,fhr 164 bpm,normal ovaries,posterior placenta,NT 1.3 mm,NB present

## 2021-05-30 NOTE — Addendum Note (Signed)
Addended by: Annamarie Dawley on: 05/30/2021 11:45 AM   Modules accepted: Orders

## 2021-05-31 ENCOUNTER — Encounter: Payer: Self-pay | Admitting: Women's Health

## 2021-05-31 DIAGNOSIS — F1290 Cannabis use, unspecified, uncomplicated: Secondary | ICD-10-CM | POA: Insufficient documentation

## 2021-05-31 LAB — PMP SCREEN PROFILE (10S), URINE
Amphetamine Scrn, Ur: NEGATIVE ng/mL
BARBITURATE SCREEN URINE: NEGATIVE ng/mL
BENZODIAZEPINE SCREEN, URINE: NEGATIVE ng/mL
CANNABINOIDS UR QL SCN: POSITIVE ng/mL — AB
Cocaine (Metab) Scrn, Ur: NEGATIVE ng/mL
Creatinine(Crt), U: 210 mg/dL (ref 20.0–300.0)
Methadone Screen, Urine: NEGATIVE ng/mL
OXYCODONE+OXYMORPHONE UR QL SCN: NEGATIVE ng/mL
Opiate Scrn, Ur: NEGATIVE ng/mL
Ph of Urine: 5.7 (ref 4.5–8.9)
Phencyclidine Qn, Ur: NEGATIVE ng/mL
Propoxyphene Scrn, Ur: NEGATIVE ng/mL

## 2021-06-01 LAB — INTEGRATED 1
Crown Rump Length: 64.2 mm
Gest. Age on Collection Date: 12.6 weeks
Maternal Age at EDD: 26.3 yr
Nuchal Translucency (NT): 1.3 mm
Number of Fetuses: 1
PAPP-A Value: 1598.3 ng/mL
Weight: 160 [lb_av]

## 2021-06-01 LAB — CBC/D/PLT+RPR+RH+ABO+RUBIGG...
Antibody Screen: NEGATIVE
Basophils Absolute: 0 10*3/uL (ref 0.0–0.2)
Basos: 0 %
EOS (ABSOLUTE): 0.1 10*3/uL (ref 0.0–0.4)
Eos: 1 %
HCV Ab: <0.1 s/co ratio (ref 0.0–0.9)
HIV Screen 4th Generation wRfx: NONREACTIVE
Hematocrit: 40 % (ref 34.0–46.6)
Hemoglobin: 13.1 g/dL (ref 11.1–15.9)
Hepatitis B Surface Ag: NEGATIVE
Immature Grans (Abs): 0 10*3/uL (ref 0.0–0.1)
Immature Granulocytes: 0 %
Lymphocytes Absolute: 2.8 10*3/uL (ref 0.7–3.1)
Lymphs: 31 %
MCH: 30.3 pg (ref 26.6–33.0)
MCHC: 32.8 g/dL (ref 31.5–35.7)
MCV: 92 fL (ref 79–97)
Monocytes Absolute: 0.5 10*3/uL (ref 0.1–0.9)
Monocytes: 6 %
Neutrophils Absolute: 5.7 10*3/uL (ref 1.4–7.0)
Neutrophils: 62 %
Platelets: 227 10*3/uL (ref 150–450)
RBC: 4.33 x10E6/uL (ref 3.77–5.28)
RDW: 12.6 % (ref 11.7–15.4)
RPR Ser Ql: NONREACTIVE
Rh Factor: NEGATIVE
Rubella Antibodies, IGG: 2.35 index (ref >0.99)
WBC: 9.2 10*3/uL (ref 3.4–10.8)

## 2021-06-01 LAB — HCV INTERPRETATION

## 2021-06-01 LAB — URINE CULTURE: Organism ID, Bacteria: NO GROWTH

## 2021-06-08 ENCOUNTER — Encounter: Payer: Self-pay | Admitting: Women's Health

## 2021-06-17 IMAGING — DX DG CHEST 1V PORT
1 series · 1 of 1 positions shown · non-contrast
Comparison: None.

CLINICAL DATA: Chest pain.  Post emesis.  Former smoker.

EXAM:
PORTABLE CHEST 1 VIEW

[chest ap]
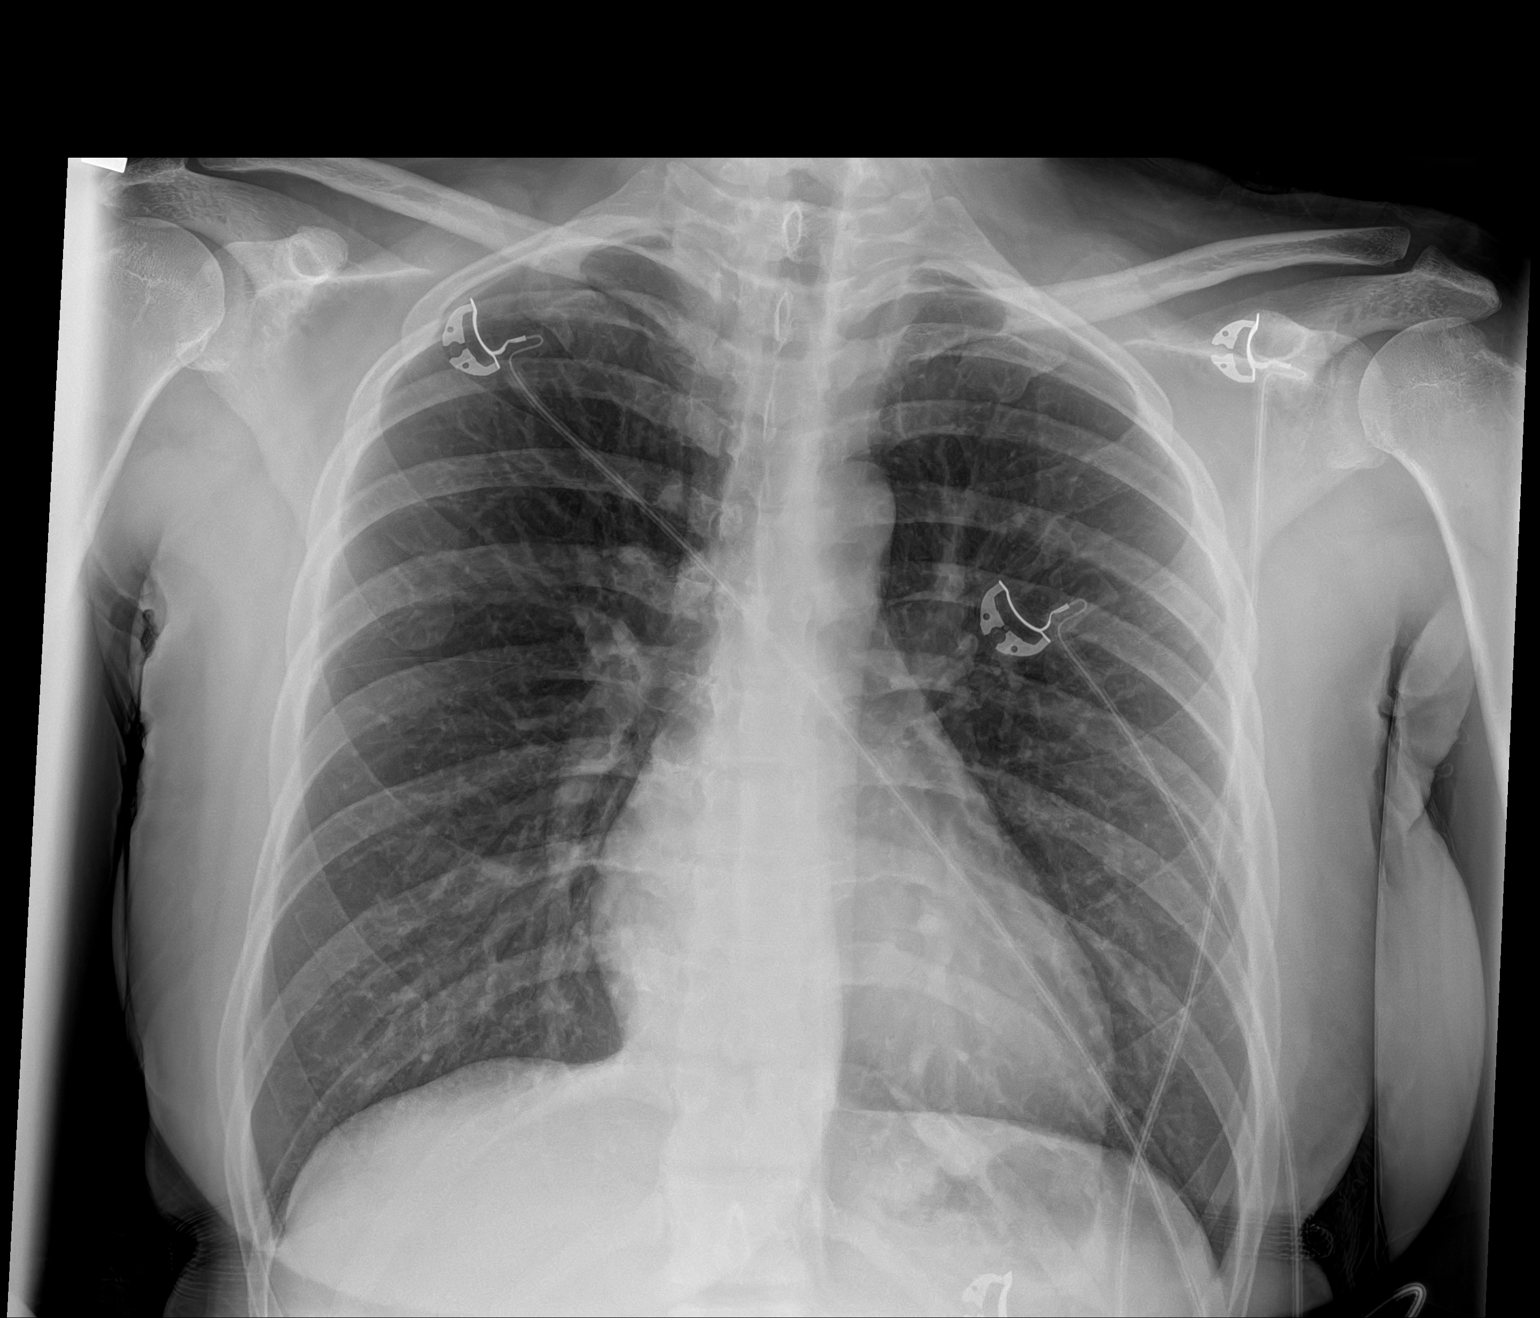

[1 of 1 positions shown; findings below may reference images not displayed]

FINDINGS: The heart size and mediastinal contours are within normal limits. No
definite pneumomediastinum.

No focal consolidation. No pulmonary edema. No pleural effusion. No
pneumothorax.

No acute osseous abnormality.

Metallic density overlying the left neck likely external to the
patient.
IMPRESSION: 1. No active disease.
2. Metallic density overlying the left neck likely external to the
patient. Please correlate with physical exam.

## 2021-06-27 ENCOUNTER — Other Ambulatory Visit (HOSPITAL_COMMUNITY)
Admission: RE | Admit: 2021-06-27 | Discharge: 2021-06-27 | Disposition: A | Discharge status: Home / Self Care | Payer: Medicaid Other | Source: Ambulatory Visit | Attending: Women's Health | Admitting: Women's Health

## 2021-06-27 ENCOUNTER — Encounter: Payer: Self-pay | Admitting: Women's Health

## 2021-06-27 ENCOUNTER — Ambulatory Visit (INDEPENDENT_AMBULATORY_CARE_PROVIDER_SITE_OTHER): Payer: Medicaid Other | Admitting: Women's Health

## 2021-06-27 ENCOUNTER — Other Ambulatory Visit: Payer: Self-pay

## 2021-06-27 VITALS — BP 115/78 | HR 102 | Wt 163.0 lb

## 2021-06-27 DIAGNOSIS — Z3480 Encounter for supervision of other normal pregnancy, unspecified trimester: Secondary | ICD-10-CM

## 2021-06-27 DIAGNOSIS — Z124 Encounter for screening for malignant neoplasm of cervix: Secondary | ICD-10-CM | POA: Insufficient documentation

## 2021-06-27 DIAGNOSIS — Z1379 Encounter for other screening for genetic and chromosomal anomalies: Secondary | ICD-10-CM

## 2021-06-27 DIAGNOSIS — Z363 Encounter for antenatal screening for malformations: Secondary | ICD-10-CM

## 2021-06-27 DIAGNOSIS — Z3A16 16 weeks gestation of pregnancy: Secondary | ICD-10-CM

## 2021-06-27 DIAGNOSIS — Z3482 Encounter for supervision of other normal pregnancy, second trimester: Secondary | ICD-10-CM

## 2021-06-27 NOTE — Progress Notes (Signed)
    LOW-RISK PREGNANCY VISIT Patient name: Kerri Soto MRN [id number]  Date of birth: [birth year] Chief Complaint:   Routine Prenatal Visit  History of Present Illness:   Kerri Soto is a 25 y.o. G57P1011 female at [redacted]w[redacted]d with an Estimated Date of Delivery: 12/09/21 being seen today for ongoing management of a low-risk pregnancy.   Today she reports no complaints. Contractions: Irritability. Vag. Bleeding: None.  Movement: Absent. denies leaking of fluid.  Depression screen PHQ 2/9 05/30/2021  Decreased Interest 1  Down, Depressed, Hopeless 1  PHQ - 2 Score 2  Altered sleeping 1  Tired, decreased energy 1  Change in appetite 1  Feeling bad or failure about yourself  0  Trouble concentrating 0  Moving slowly or fidgety/restless 0  Suicidal thoughts 0  PHQ-9 Score 5     GAD 7 : Generalized Anxiety Score 05/30/2021  Nervous, Anxious, on Edge 0  Control/stop worrying 1  Worry too much - different things 1  Trouble relaxing 0  Restless 1  Easily annoyed or irritable 1  Afraid - awful might happen 0  Total GAD 7 Score 4      Review of Systems:   Pertinent items are noted in HPI Denies abnormal vaginal discharge w/ itching/odor/irritation, headaches, visual changes, shortness of breath, chest pain, abdominal pain, severe nausea/vomiting, or problems with urination or bowel movements unless otherwise stated above. Pertinent History Reviewed:  Reviewed past medical,surgical, social, obstetrical and family history.  Reviewed problem list, medications and allergies. Physical Assessment:   Vitals:   06/27/21 1057  BP: 115/78  Pulse: (!) 102  Weight: 163 lb (73.9 kg)  Body mass index is 26.31 kg/m.        Physical Examination:   General appearance: Well appearing, and in no distress  Mental status: Alert, oriented to person, place, and time  Skin: Warm & dry  Cardiovascular: Normal heart rate noted  Respiratory: Normal respiratory effort, no distress  Abdomen:  Soft, gravid, nontender  Pelvic:  thin prep pap obtained          Extremities: Edema: None  Fetal Status: Fetal Heart Rate (bpm): 148   Movement: Absent    Chaperone: N/A   No results found for this or any previous visit (from the past 24 hour(s)).  Assessment & Plan:  1) Low-risk pregnancy G3P1011 at [redacted]w[redacted]d with an Estimated Date of Delivery: 12/09/21    Meds: No orders of the defined types were placed in this encounter.  Labs/procedures today: pap and 2nd IT, declined flu shot  Plan:  Continue routine obstetrical care  Next visit: prefers will be in person for u/s     Reviewed: Preterm labor symptoms and general obstetric precautions including but not limited to vaginal bleeding, contractions, leaking of fluid and fetal movement were reviewed in detail with the patient.  All questions were answered. Does have home bp cuff. Office bp cuff given: not applicable. Check bp weekly, let us know if consistently >140 and/or >90.  Follow-up: Return in about 3 weeks (around 07/18/2021) for LROB, NI:[id number], CNM, in person.  Future Appointments  Date Time Provider Department Center  07/26/2021  9:45 AM [facility] [place] [id number] None  07/26/2021 10:30 AM Cheral Marker, CNM [facility]    Orders Placed This Encounter  Procedures   US OB Comp + 14 Wk   INTEGRATED 2   Cheral Marker CNM, [facility] 06/27/2021 11:30 AM

## 2021-06-27 NOTE — Patient Instructions (Signed)
Kerri Soto, thank you for choosing our office today! We appreciate the opportunity to meet your healthcare needs. You may receive a short survey by mail, e-mail, or through [employer]. If you are happy with your care we would appreciate if you could take just a few minutes to complete the survey questions. We read all of your comments and take your feedback very seriously. Thank you again for choosing our office.  Center for [employer] Team at [facility] at [facility] ([address] [place], [place] [address]) Entrance C, located off of [address] Free 24/7 valet parking  Go to [email or web address] to register for FREE online childbirth classes  Call the office [phone]) or go to [facility] if: You begin to severe cramping Your water breaks.  Sometimes it is a big gush of fluid, sometimes it is just a trickle that keeps getting your panties wet or running down your legs You have vaginal bleeding.  It is normal to have a small amount of spotting if your cervix was checked.   [facility] Pediatricians/Family Doctors [facility]): [address] Colette Ribas, [phone]           [facility]: [address], [phone]                [facility]): [address], [phone] (call to ask if accepting patients) [facility] Department: [address], [place], [phone]    [facility] Pediatricians/Family Doctors [facility]): [phone] [address], [phone] [facility]: [address] [place], [phone] [facility]: [address], [phone]  [facility] Doctors  [facility]): [phone] [facility]: [address], [phone]   [facility] Doctors [facility]: [address] [phone]  [facility] Doctors  [facility]: [phone], [phone]  Home Blood Pressure Monitoring for Patients   Your provider has recommended that you check your blood pressure (BP) at least once a week at home. If you do not have a blood pressure cuff at home, one will be provided for you. Contact your provider if you have not received your monitor within 1 week.   Helpful Tips for Accurate Home Blood Pressure Checks  Don't smoke, exercise, or drink caffeine 30 minutes before checking your BP Use the restroom before checking your BP (a full bladder can raise your pressure) Relax in a comfortable upright chair Feet on the ground Left arm resting comfortably on a flat surface at the level of your heart Legs uncrossed Back supported Sit quietly and don't talk Place the cuff on your bare arm Adjust snuggly, so that only two fingertips can fit between your skin and the top of the cuff Check 2 readings separated by at least one minute Keep a log of your BP readings For a visual, please reference this diagram: http://ccnc.care/bpdiagram  Provider Name: [facility]     Phone: [phone]  Zone 1: ALL CLEAR  Continue to monitor your symptoms:  BP reading is less than 140 (top number) or less than 90 (bottom number)  No right upper stomach pain No headaches or seeing spots No feeling nauseated or throwing up No swelling in face and hands  Zone 2: CAUTION Call your doctor's office for any of the following:  BP reading is greater than 140 (top number) or greater than  90 (bottom number)  Stomach pain under your ribs in the middle or right side Headaches or seeing spots Feeling nauseated or throwing up Swelling in face and hands  Zone 3: EMERGENCY  Seek immediate medical care if you have any of the following:  BP reading is greater than160 (top number) or greater than 110 (bottom number) Severe headaches not improving with Tylenol Serious difficulty catching your breath Any worsening symptoms from  Zone 2     Second Trimester of Pregnancy The second trimester is from week 14 through week 27 (months 4 through 6). The second trimester is often a time when you feel your best. Your body has adjusted to being pregnant, and you begin to feel better physically. Usually, morning sickness has lessened or quit completely, you may have more energy, and you may have an increase in appetite. The second trimester is also a time when the fetus is growing rapidly. At the end of the sixth month, the fetus is about 9 inches long and weighs about 1 pounds. You will likely begin to feel the baby move (quickening) between 16 and 20 weeks of pregnancy. Body changes during your second trimester Your body continues to go through many changes during your second trimester. The changes vary from woman to woman. Your weight will continue to increase. You will notice your lower abdomen bulging out. You may begin to get stretch marks on your hips, abdomen, and breasts. You may develop headaches that can be relieved by medicines. The medicines should be approved by your health care provider. You may urinate more often because the fetus is pressing on your bladder. You may develop or continue to have heartburn as a result of your pregnancy. You may develop constipation because certain hormones are causing the muscles that push waste through your intestines to slow down. You may develop hemorrhoids or swollen, bulging veins (varicose veins). You may have back pain. This is caused by: Weight gain. Pregnancy hormones that are relaxing the joints in your pelvis. A shift in weight and the muscles that support your balance. Your breasts will continue to grow and they will continue to become tender. Your gums may bleed and may be sensitive to brushing and flossing. Dark spots or blotches (chloasma, mask of pregnancy) may develop on your face. This will likely fade after the baby is born. A dark line from your belly button to  the pubic area (linea nigra) may appear. This will likely fade after the baby is born. You may have changes in your hair. These can include thickening of your hair, rapid growth, and changes in texture. Some women also have hair loss during or after pregnancy, or hair that feels dry or thin. Your hair will most likely return to normal after your baby is born.  What to expect at prenatal visits During a routine prenatal visit: You will be weighed to make sure you and the fetus are growing normally. Your blood pressure will be taken. Your abdomen will be measured to track your baby's growth. The fetal heartbeat will be listened to. Any test results from the previous visit will be discussed.  Your health care provider may ask you: How you are feeling. If you are feeling the baby move. If you have had any abnormal symptoms, such as leaking fluid, bleeding, severe headaches, or abdominal cramping. If you are using any tobacco products, including cigarettes, chewing tobacco, and electronic cigarettes. If you have any questions.  Other tests that may be performed during   your second trimester include: Blood tests that check for: Low iron levels (anemia). High blood sugar that affects pregnant women (gestational diabetes) between 24 and 28 weeks. Rh antibodies. This is to check for a protein on red blood cells (Rh factor). Urine tests to check for infections, diabetes, or protein in the urine. An ultrasound to confirm the proper growth and development of the baby. An amniocentesis to check for possible genetic problems. Fetal screens for spina bifida and Down syndrome. HIV (human immunodeficiency virus) testing. Routine prenatal testing includes screening for HIV, unless you choose not to have this test.  Follow these instructions at home: Medicines Follow your health care provider's instructions regarding medicine use. Specific medicines may be either safe or unsafe to take during  pregnancy. Take a prenatal vitamin that contains at least 600 micrograms (mcg) of folic acid. If you develop constipation, try taking a stool softener if your health care provider approves. Eating and drinking Eat a balanced diet that includes fresh fruits and vegetables, whole grains, good sources of protein such as meat, eggs, or tofu, and low-fat dairy. Your health care provider will help you determine the amount of weight gain that is right for you. Avoid raw meat and uncooked cheese. These carry germs that can cause birth defects in the baby. If you have low calcium intake from food, talk to your health care provider about whether you should take a daily calcium supplement. Limit foods that are high in fat and processed sugars, such as fried and sweet foods. To prevent constipation: Drink enough fluid to keep your urine clear or pale yellow. Eat foods that are high in fiber, such as fresh fruits and vegetables, whole grains, and beans. Activity Exercise only as directed by your health care provider. Most women can continue their usual exercise routine during pregnancy. Try to exercise for 30 minutes at least 5 days a week. Stop exercising if you experience uterine contractions. Avoid heavy lifting, wear low heel shoes, and practice good posture. A sexual relationship may be continued unless your health care provider directs you otherwise. Relieving pain and discomfort Wear a good support bra to prevent discomfort from breast tenderness. Take warm sitz baths to soothe any pain or discomfort caused by hemorrhoids. Use hemorrhoid cream if your health care provider approves. Rest with your legs elevated if you have leg cramps or low back pain. If you develop varicose veins, wear support hose. Elevate your feet for 15 minutes, 3-4 times a day. Limit salt in your diet. Prenatal Care Write down your questions. Take them to your prenatal visits. Keep all your prenatal visits as told by your health  care provider. This is important. Safety Wear your seat belt at all times when driving. Make a list of emergency phone numbers, including numbers for family, friends, the hospital, and police and fire departments. General instructions Ask your health care provider for a referral to a local prenatal education class. Begin classes no later than the beginning of month 6 of your pregnancy. Ask for help if you have counseling or nutritional needs during pregnancy. Your health care provider can offer advice or refer you to specialists for help with various needs. Do not use hot tubs, steam rooms, or saunas. Do not douche or use tampons or scented sanitary pads. Do not cross your legs for long periods of time. Avoid cat litter boxes and soil used by cats. These carry germs that can cause birth defects in the baby and possibly loss of the   fetus by miscarriage or stillbirth. Avoid all smoking, herbs, alcohol, and unprescribed drugs. Chemicals in these products can affect the formation and growth of the baby. Do not use any products that contain nicotine or tobacco, such as cigarettes and e-cigarettes. If you need help quitting, ask your health care provider. Visit your dentist if you have not gone yet during your pregnancy. Use a soft toothbrush to brush your teeth and be gentle when you floss. Contact a health care provider if: You have dizziness. You have mild pelvic cramps, pelvic pressure, or nagging pain in the abdominal area. You have persistent nausea, vomiting, or diarrhea. You have a bad smelling vaginal discharge. You have pain when you urinate. Get help right away if: You have a fever. You are leaking fluid from your vagina. You have spotting or bleeding from your vagina. You have severe abdominal cramping or pain. You have rapid weight gain or weight loss. You have shortness of breath with chest pain. You notice sudden or extreme swelling of your face, hands, ankles, feet, or legs. You  have not felt your baby move in over an hour. You have severe headaches that do not go away when you take medicine. You have vision changes. Summary The second trimester is from week 14 through week 27 (months 4 through 6). It is also a time when the fetus is growing rapidly. Your body goes through many changes during pregnancy. The changes vary from woman to woman. Avoid all smoking, herbs, alcohol, and unprescribed drugs. These chemicals affect the formation and growth your baby. Do not use any tobacco products, such as cigarettes, chewing tobacco, and e-cigarettes. If you need help quitting, ask your health care provider. Contact your health care provider if you have any questions. Keep all prenatal visits as told by your health care provider. This is important. This information is not intended to replace advice given to you by your health care provider. Make sure you discuss any questions you have with your health care provider. Document Released: 09/11/2001 Document Revised: 02/23/2016 Document Reviewed: 11/18/2012 Elsevier Interactive Patient Education  2017 Elsevier Inc.  

## 2021-06-30 LAB — INTEGRATED 2
AFP MoM: 1
Alpha-Fetoprotein: 37.4 ng/mL
Crown Rump Length: 64.2 mm
DIA MoM: 0.78
DIA Value: 117 pg/mL
Estriol, Unconjugated: 0.91 ng/mL
Gest. Age on Collection Date: 12.6 weeks
Gestational Age: 16.6 weeks
Maternal Age at EDD: 26.3 yr
Nuchal Translucency (NT): 1.3 mm
Nuchal Translucency MoM: 0.9
Number of Fetuses: 1
PAPP-A MoM: 1.75
PAPP-A Value: 1598.3 ng/mL
Test Results:: NEGATIVE
Weight: 160 [lb_av]
Weight: 160 [lb_av]
hCG MoM: 1.21
hCG Value: 39.9 IU/mL
uE3 MoM: 0.86

## 2021-07-05 LAB — CYTOLOGY - PAP
Chlamydia: NEGATIVE
Comment: NEGATIVE
Comment: NEGATIVE
Comment: NEGATIVE
Comment: NORMAL
Diagnosis: UNDETERMINED — AB
HPV 16: NEGATIVE
HPV 18 / 45: NEGATIVE
High risk HPV: POSITIVE — AB
Neisseria Gonorrhea: NEGATIVE

## 2021-07-10 ENCOUNTER — Encounter: Payer: Self-pay | Admitting: Women's Health

## 2021-07-10 DIAGNOSIS — R87619 Unspecified abnormal cytological findings in specimens from cervix uteri: Secondary | ICD-10-CM | POA: Insufficient documentation

## 2021-07-26 ENCOUNTER — Ambulatory Visit (INDEPENDENT_AMBULATORY_CARE_PROVIDER_SITE_OTHER): Payer: Medicaid Other

## 2021-07-26 ENCOUNTER — Ambulatory Visit (INDEPENDENT_AMBULATORY_CARE_PROVIDER_SITE_OTHER): Payer: Medicaid Other | Admitting: Women's Health

## 2021-07-26 ENCOUNTER — Other Ambulatory Visit: Payer: Self-pay

## 2021-07-26 ENCOUNTER — Encounter: Payer: Self-pay | Admitting: Women's Health

## 2021-07-26 VITALS — BP 99/67 | HR 90 | Wt 172.0 lb

## 2021-07-26 DIAGNOSIS — Z3A20 20 weeks gestation of pregnancy: Secondary | ICD-10-CM

## 2021-07-26 DIAGNOSIS — R87610 Atypical squamous cells of undetermined significance on cytologic smear of cervix (ASC-US): Secondary | ICD-10-CM

## 2021-07-26 DIAGNOSIS — Z3480 Encounter for supervision of other normal pregnancy, unspecified trimester: Secondary | ICD-10-CM

## 2021-07-26 DIAGNOSIS — Z363 Encounter for antenatal screening for malformations: Secondary | ICD-10-CM

## 2021-07-26 DIAGNOSIS — O26899 Other specified pregnancy related conditions, unspecified trimester: Secondary | ICD-10-CM

## 2021-07-26 DIAGNOSIS — Z3482 Encounter for supervision of other normal pregnancy, second trimester: Secondary | ICD-10-CM

## 2021-07-26 NOTE — Progress Notes (Signed)
LOW-RISK PREGNANCY VISIT WITH COLPOSCOPY Patient name: Kerri Soto MRN [id number]  Date of birth: [birth year] Chief Complaint:   Routine Prenatal Visit ([place] & colpo today)  History of Present Illness:   Kerri Soto is a 25 y.o. G65P1011 female at [redacted]w[redacted]d with an Estimated Date of Delivery: 12/09/21 being seen today for ongoing management of a low-risk pregnancy.   Today she reports no complaints. Contractions: Not present. Vag. Bleeding: None.  Movement: Present. denies leaking of fluid.  Depression screen PHQ 2/9 05/30/2021  Decreased Interest 1  Down, Depressed, Hopeless 1  PHQ - 2 Score 2  Altered sleeping 1  Tired, decreased energy 1  Change in appetite 1  Feeling bad or failure about yourself  0  Trouble concentrating 0  Moving slowly or fidgety/restless 0  Suicidal thoughts 0  PHQ-9 Score 5     GAD 7 : Generalized Anxiety Score 05/30/2021  Nervous, Anxious, on Edge 0  Control/stop worrying 1  Worry too much - different things 1  Trouble relaxing 0  Restless 1  Easily annoyed or irritable 1  Afraid - awful might happen 0  Total GAD 7 Score 4      Review of Systems:   Pertinent items are noted in HPI Denies abnormal vaginal discharge w/ itching/odor/irritation, headaches, visual changes, shortness of breath, chest pain, abdominal pain, severe nausea/vomiting, or problems with urination or bowel movements unless otherwise stated above. Pertinent History Reviewed:  Reviewed past medical,surgical, social, obstetrical and family history.  Reviewed problem list, medications and allergies. Physical Assessment:   Vitals:   07/26/21 1041  BP: 99/67  Pulse: 90  Weight: 172 lb (78 kg)  Body mass index is 27.76 kg/m.        Physical Examination:   General appearance: Well appearing, and in no distress  Mental status: Alert, oriented to person, place, and time  Skin: Warm & dry  Cardiovascular: Normal heart rate noted  Respiratory: Normal respiratory effort,  no distress  Abdomen: Soft, gravid, nontender  Pelvic: Colpo (see note below)             Extremities: Edema: None  Fetal Status: Fetal Heart Rate (bpm): 174 u/s   Movement: Present    Korea 20+4 wks,breech,cx 5.2 cm,posterior placenta gr 0,normal ovaries,anterior fibroid 3.6 x 2.7 x 1.9 cm,SVP of fluid 4.3 cm,FHR 174 bpm,EFW 322 g 16.6%,anatomy complete,no obvious abnormalities     Assessment & Plan:  1) Low-risk pregnancy G3P1011 at [redacted]w[redacted]d with an Estimated Date of Delivery: 12/09/21   2) Anterior fibroid, small, 3.6cm, discussed w/ pt   Meds: No orders of the defined types were placed in this encounter.  Labs/procedures today: colpo and U/S  Plan:  Continue routine obstetrical care  Next visit: prefers in person    Reviewed: Preterm labor symptoms and general obstetric precautions including but not limited to vaginal bleeding, contractions, leaking of fluid and fetal movement were reviewed in detail with the patient.  All questions were answered. Does have home bp cuff. Office bp cuff given: not applicable. Check bp weekly, let us know if consistently >140 and/or >90.  Follow-up: Return in about 4 weeks (around 08/23/2021) for LROB, CNM, in person.  Future Appointments  Date Time Provider Department Center  08/23/2021  8:30 AM Cheral Marker, CNM [facility]    No orders of the defined types were placed in this encounter.  Cheral Marker CNM, [facility] 07/26/2021 11:02 AM    COLPOSCOPY PROCEDURE NOTE Patient name:  Kerri Soto MRN [id number]  Date of birth: Oct 03, 1995 Subjective Findings:   Kerri Soto is a 25 y.o. G13P1011 African American female at [redacted]w[redacted]d being seen today for a colposcopy. Indication: Abnormal pap on 06/27/21: ASCUS w/ HRHPV positive: other (not 16, 18/45)  Prior cytology: none Patient's last menstrual period was 03/04/2021 (exact date). Contraception: none. Menopausal: no. Hysterectomy: no.   Smoker: former .  Immunocompromised: yes  pregnant .  The risks and benefits were explained and informed consent was obtained, and written copy is in chart. Pertinent History Reviewed:   Reviewed past medical,surgical, social, obstetrical and family history.  Reviewed problem list, medications and allergies. Objective Findings & Procedure:   Vitals:   07/26/21 1041  BP: 99/67  Pulse: 90  Weight: 172 lb (78 kg)  Body mass index is 27.76 kg/m.  Time out was performed.  Speculum placed in the vagina, cervix fully visualized. SCJ: fully visualized. Cervix swabbed x 3 with acetic acid.  Acetowhitening present: Yes Cervix: no visible lesions, no mosaicism, no punctation, no abnormal vasculature, and light acetowhite lesion(s) noted at 9, 3 o'clock. No biopsies taken. Vagina: vaginal colposcopy not performed Vulva: vulvar colposcopy not performed  Specimens: 0  Complications: none  Chaperone: Malachy Mood    Colposcopic Impression & Plan:   KA/LSIL Plan: Repeat colpo 8-12wks pp  Return in about 4 weeks (around 08/23/2021) for LROB, CNM, in person.  Cheral Marker CNM, [facility] 07/26/2021 11:02 AM

## 2021-07-26 NOTE — Patient Instructions (Signed)
Kerri Soto, thank you for choosing our office today! We appreciate the opportunity to meet your healthcare needs. You may receive a short survey by mail, e-mail, or through [employer]. If you are happy with your care we would appreciate if you could take just a few minutes to complete the survey questions. We read all of your comments and take your feedback very seriously. Thank you again for choosing our office.  Center for [employer] Team at [facility] at [facility] ([address] [place], [place] [address]) Entrance C, located off of [address] Free 24/7 valet parking  Go to [email or web address] to register for FREE online childbirth classes  Call the office [phone]) or go to [facility] if: You begin to severe cramping Your water breaks.  Sometimes it is a big gush of fluid, sometimes it is just a trickle that keeps getting your panties wet or running down your legs You have vaginal bleeding.  It is normal to have a small amount of spotting if your cervix was checked.   [facility] Pediatricians/Family Doctors [facility]): [address] Colette Ribas, [phone]           [facility]: [address], [phone]                [facility]): [address], [phone] (call to ask if accepting patients) [facility] Department: [address], [place], [phone]    [facility] Pediatricians/Family Doctors [facility]): [phone] [address], [phone] [facility]: [address] [place], [phone] [facility]: [address], [phone]  [facility] Doctors  [facility]): [phone] [facility]: [address], [phone]   [facility] Doctors [facility]: [address] [phone]  [facility] Doctors  [facility]: [phone], [phone]  Home Blood Pressure Monitoring for Patients   Your provider has recommended that you check your blood pressure (BP) at least once a week at home. If you do not have a blood pressure cuff at home, one will be provided for you. Contact your provider if you have not received your monitor within 1 week.   Helpful Tips for Accurate Home Blood Pressure Checks  Don't smoke, exercise, or drink caffeine 30 minutes before checking your BP Use the restroom before checking your BP (a full bladder can raise your pressure) Relax in a comfortable upright chair Feet on the ground Left arm resting comfortably on a flat surface at the level of your heart Legs uncrossed Back supported Sit quietly and don't talk Place the cuff on your bare arm Adjust snuggly, so that only two fingertips can fit between your skin and the top of the cuff Check 2 readings separated by at least one minute Keep a log of your BP readings For a visual, please reference this diagram: http://ccnc.care/bpdiagram  Provider Name: [facility]     Phone: [phone]  Zone 1: ALL CLEAR  Continue to monitor your symptoms:  BP reading is less than 140 (top number) or less than 90 (bottom number)  No right upper stomach pain No headaches or seeing spots No feeling nauseated or throwing up No swelling in face and hands  Zone 2: CAUTION Call your doctor's office for any of the following:  BP reading is greater than 140 (top number) or greater than  90 (bottom number)  Stomach pain under your ribs in the middle or right side Headaches or seeing spots Feeling nauseated or throwing up Swelling in face and hands  Zone 3: EMERGENCY  Seek immediate medical care if you have any of the following:  BP reading is greater than160 (top number) or greater than 110 (bottom number) Severe headaches not improving with Tylenol Serious difficulty catching your breath Any worsening symptoms from  Zone 2     Second Trimester of Pregnancy The second trimester is from week 14 through week 27 (months 4 through 6). The second trimester is often a time when you feel your best. Your body has adjusted to being pregnant, and you begin to feel better physically. Usually, morning sickness has lessened or quit completely, you may have more energy, and you may have an increase in appetite. The second trimester is also a time when the fetus is growing rapidly. At the end of the sixth month, the fetus is about 9 inches long and weighs about 1 pounds. You will likely begin to feel the baby move (quickening) between 16 and 20 weeks of pregnancy. Body changes during your second trimester Your body continues to go through many changes during your second trimester. The changes vary from woman to woman. Your weight will continue to increase. You will notice your lower abdomen bulging out. You may begin to get stretch marks on your hips, abdomen, and breasts. You may develop headaches that can be relieved by medicines. The medicines should be approved by your health care provider. You may urinate more often because the fetus is pressing on your bladder. You may develop or continue to have heartburn as a result of your pregnancy. You may develop constipation because certain hormones are causing the muscles that push waste through your intestines to slow down. You may develop hemorrhoids or swollen, bulging veins (varicose veins). You may have back pain. This is caused by: Weight gain. Pregnancy hormones that are relaxing the joints in your pelvis. A shift in weight and the muscles that support your balance. Your breasts will continue to grow and they will continue to become tender. Your gums may bleed and may be sensitive to brushing and flossing. Dark spots or blotches (chloasma, mask of pregnancy) may develop on your face. This will likely fade after the baby is born. A dark line from your belly button to  the pubic area (linea nigra) may appear. This will likely fade after the baby is born. You may have changes in your hair. These can include thickening of your hair, rapid growth, and changes in texture. Some women also have hair loss during or after pregnancy, or hair that feels dry or thin. Your hair will most likely return to normal after your baby is born.  What to expect at prenatal visits During a routine prenatal visit: You will be weighed to make sure you and the fetus are growing normally. Your blood pressure will be taken. Your abdomen will be measured to track your baby's growth. The fetal heartbeat will be listened to. Any test results from the previous visit will be discussed.  Your health care provider may ask you: How you are feeling. If you are feeling the baby move. If you have had any abnormal symptoms, such as leaking fluid, bleeding, severe headaches, or abdominal cramping. If you are using any tobacco products, including cigarettes, chewing tobacco, and electronic cigarettes. If you have any questions.  Other tests that may be performed during   your second trimester include: Blood tests that check for: Low iron levels (anemia). High blood sugar that affects pregnant women (gestational diabetes) between 24 and 28 weeks. Rh antibodies. This is to check for a protein on red blood cells (Rh factor). Urine tests to check for infections, diabetes, or protein in the urine. An ultrasound to confirm the proper growth and development of the baby. An amniocentesis to check for possible genetic problems. Fetal screens for spina bifida and Down syndrome. HIV (human immunodeficiency virus) testing. Routine prenatal testing includes screening for HIV, unless you choose not to have this test.  Follow these instructions at home: Medicines Follow your health care provider's instructions regarding medicine use. Specific medicines may be either safe or unsafe to take during  pregnancy. Take a prenatal vitamin that contains at least 600 micrograms (mcg) of folic acid. If you develop constipation, try taking a stool softener if your health care provider approves. Eating and drinking Eat a balanced diet that includes fresh fruits and vegetables, whole grains, good sources of protein such as meat, eggs, or tofu, and low-fat dairy. Your health care provider will help you determine the amount of weight gain that is right for you. Avoid raw meat and uncooked cheese. These carry germs that can cause birth defects in the baby. If you have low calcium intake from food, talk to your health care provider about whether you should take a daily calcium supplement. Limit foods that are high in fat and processed sugars, such as fried and sweet foods. To prevent constipation: Drink enough fluid to keep your urine clear or pale yellow. Eat foods that are high in fiber, such as fresh fruits and vegetables, whole grains, and beans. Activity Exercise only as directed by your health care provider. Most women can continue their usual exercise routine during pregnancy. Try to exercise for 30 minutes at least 5 days a week. Stop exercising if you experience uterine contractions. Avoid heavy lifting, wear low heel shoes, and practice good posture. A sexual relationship may be continued unless your health care provider directs you otherwise. Relieving pain and discomfort Wear a good support bra to prevent discomfort from breast tenderness. Take warm sitz baths to soothe any pain or discomfort caused by hemorrhoids. Use hemorrhoid cream if your health care provider approves. Rest with your legs elevated if you have leg cramps or low back pain. If you develop varicose veins, wear support hose. Elevate your feet for 15 minutes, 3-4 times a day. Limit salt in your diet. Prenatal Care Write down your questions. Take them to your prenatal visits. Keep all your prenatal visits as told by your health  care provider. This is important. Safety Wear your seat belt at all times when driving. Make a list of emergency phone numbers, including numbers for family, friends, the hospital, and police and fire departments. General instructions Ask your health care provider for a referral to a local prenatal education class. Begin classes no later than the beginning of month 6 of your pregnancy. Ask for help if you have counseling or nutritional needs during pregnancy. Your health care provider can offer advice or refer you to specialists for help with various needs. Do not use hot tubs, steam rooms, or saunas. Do not douche or use tampons or scented sanitary pads. Do not cross your legs for long periods of time. Avoid cat litter boxes and soil used by cats. These carry germs that can cause birth defects in the baby and possibly loss of the   fetus by miscarriage or stillbirth. Avoid all smoking, herbs, alcohol, and unprescribed drugs. Chemicals in these products can affect the formation and growth of the baby. Do not use any products that contain nicotine or tobacco, such as cigarettes and e-cigarettes. If you need help quitting, ask your health care provider. Visit your dentist if you have not gone yet during your pregnancy. Use a soft toothbrush to brush your teeth and be gentle when you floss. Contact a health care provider if: You have dizziness. You have mild pelvic cramps, pelvic pressure, or nagging pain in the abdominal area. You have persistent nausea, vomiting, or diarrhea. You have a bad smelling vaginal discharge. You have pain when you urinate. Get help right away if: You have a fever. You are leaking fluid from your vagina. You have spotting or bleeding from your vagina. You have severe abdominal cramping or pain. You have rapid weight gain or weight loss. You have shortness of breath with chest pain. You notice sudden or extreme swelling of your face, hands, ankles, feet, or legs. You  have not felt your baby move in over an hour. You have severe headaches that do not go away when you take medicine. You have vision changes. Summary The second trimester is from week 14 through week 27 (months 4 through 6). It is also a time when the fetus is growing rapidly. Your body goes through many changes during pregnancy. The changes vary from woman to woman. Avoid all smoking, herbs, alcohol, and unprescribed drugs. These chemicals affect the formation and growth your baby. Do not use any tobacco products, such as cigarettes, chewing tobacco, and e-cigarettes. If you need help quitting, ask your health care provider. Contact your health care provider if you have any questions. Keep all prenatal visits as told by your health care provider. This is important. This information is not intended to replace advice given to you by your health care provider. Make sure you discuss any questions you have with your health care provider. Document Released: 09/11/2001 Document Revised: 02/23/2016 Document Reviewed: 11/18/2012 Elsevier Interactive Patient Education  2017 [employer] Inc[employer]   Colposcopy, Care After This sheet gives you information about how to care for yourself after your procedure. Your health care provider may also give you more specific instructions. If you have problems or questions, contact your health care provider. What can I expect after the procedure? If you had a colposcopy without a biopsy, you can expect to feel fine right away after your procedure. However, you may have some spotting of blood for a few days. You can return to your normal activities. If you had a colposcopy with a biopsy, it is common after the procedure to have: Soreness and mild pain. These may last for a few days. Light-headedness. Mild vaginal bleeding or discharge that is dark-colored and grainy. This may last for a few days. The discharge may be caused by a liquid (solution) that was used during the  procedure. You may need to wear a sanitary pad during this time. Spotting of blood for at least 48 hours after the procedure. Follow these instructions at home: Medicines Take over-the-counter and prescription medicines only as told by your health care provider. Talk with your health care provider about what type of over-the-counter pain medicine and prescription medicine you can start to take again. It is especially important to talk with your health care provider if you take blood thinners. Activity Limit your physical activity for the first day after your procedure as  told by your health care provider. Avoid using douche products, using tampons, or having sex for at least 3 days after the procedure or for as long as told. Return to your normal activities as told by your health care provider. Ask your health care provider what activities are safe for you. General instructions  Drink enough fluid to keep your urine pale yellow. Ask your health care provider if you may take baths, swim, or use a hot tub. You may take showers. If you use birth control (contraception), continue to use it. Keep all follow-up visits as told by your health care provider. This is important. Contact a health care provider if: You develop a skin rash. Get help right away if: You bleed a lot from your vagina or pass blood clots. This includes using more than one sanitary pad each hour for 2 hours in a row. You have a fever or chills. You have vaginal discharge that is abnormal, is yellow in color, or smells bad. This could be a sign of infection. You have severe pain or cramps in your lower abdomen that do not go away with medicine. You faint. Summary If you had a colposcopy without a biopsy, you can expect to feel fine right away, but you may have some spotting of blood for a few days. You can return to your normal activities. If you had a colposcopy with a biopsy, it is common to have mild pain for a few days and  spotting for 48 hours after the procedure. Avoid using douche products, using tampons, and having sex for at least 3 days after the procedure or for as long as told by your health care provider. Get help right away if you have heavy bleeding, severe pain, or signs of infection. This information is not intended to replace advice given to you by your health care provider. Make sure you discuss any questions you have with your health care provider. Document Revised: 09/16/2019 Document Reviewed: 09/16/2019 Elsevier Patient Education  2022 [employer].

## 2021-07-26 NOTE — Progress Notes (Signed)
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

## 2021-08-23 ENCOUNTER — Ambulatory Visit (INDEPENDENT_AMBULATORY_CARE_PROVIDER_SITE_OTHER): Payer: Medicaid Other | Admitting: Women's Health

## 2021-08-23 ENCOUNTER — Encounter: Payer: Self-pay | Admitting: Women's Health

## 2021-08-23 ENCOUNTER — Other Ambulatory Visit: Payer: Self-pay

## 2021-08-23 VITALS — BP 112/72 | HR 95 | Wt 176.0 lb

## 2021-08-23 DIAGNOSIS — Z3482 Encounter for supervision of other normal pregnancy, second trimester: Secondary | ICD-10-CM

## 2021-08-23 DIAGNOSIS — Z3480 Encounter for supervision of other normal pregnancy, unspecified trimester: Secondary | ICD-10-CM

## 2021-08-23 NOTE — Patient Instructions (Signed)
Kerri Soto, thank you for choosing our office today! We appreciate the opportunity to meet your healthcare needs. You may receive a short survey by mail, e-mail, or through [employer]. If you are happy with your care we would appreciate if you could take just a few minutes to complete the survey questions. We read all of your comments and take your feedback very seriously. Thank you again for choosing our office.  Center for [employer] Team at [facility]  [facility] at [facility] ([address] [place], [place] [address]) Entrance C, located off of [address] Free 24/7 valet parking   You will have your sugar test next visit.  Please do not eat or drink anything after midnight the night before you come, not even water.  You will be here for at least two hours.  Please make an appointment online for the bloodwork at [email or web address] for 8:00am (or as close to this as possible). Make sure you select the [facility] service center.   CLASSES: Go to [email or web address] to register for classes (childbirth, breastfeeding, waterbirth, infant CPR, daddy bootcamp, etc.)  Call the office [phone]) or go to [facility] if: You begin to have strong, frequent contractions Your water breaks.  Sometimes it is a big gush of fluid, sometimes it is just a trickle that keeps getting your panties wet or running down your legs You have vaginal bleeding.  It is normal to have a small amount of spotting if your cervix was checked.  You don't feel your baby moving like normal.  If you don't, get you something to eat and drink and lay down and focus on feeling your baby move.   If your baby is still not moving like normal, you should call the office or go to [facility].  Call the office [phone]) or go to [facility] hospital for these signs of pre-eclampsia: Severe headache that does not go away with Tylenol Visual changes- seeing spots, double, blurred vision Pain under your right breast or  upper abdomen that does not go away with Tums or heartburn medicine Nausea and/or vomiting Severe swelling in your hands, feet, and face    [facility] Pediatricians/Family Doctors [facility]): [address] Colette Ribas, [phone]           [facility]: [address], [phone]                [facility]): [address], [phone] (call to ask if accepting patients) [facility]: [address], [place], [phone]    [facility] Pediatricians/Family Doctors [facility]): [address], [phone] [facility]: [address] [place], [phone] [facility]: [address], [phone]  [facility] Doctors  [facility]): [phone] [facility]: [address], [phone]   [facility] Doctors [facility]: [address], [phone]  [facility] Doctors  [facility]: [phone], [phone]  Home Blood Pressure Monitoring for Patients   Your provider has recommended that you check your blood pressure (BP) at least once a week at home. If you do not have a blood pressure cuff at home, one will be provided for you. Contact your provider if you have not received your monitor within 1 week.   Helpful Tips for Accurate Home Blood Pressure Checks  Don't smoke, exercise, or drink  caffeine 30 minutes before checking your BP Use the restroom before checking your BP (a full bladder can raise your pressure) Relax in a comfortable upright chair Feet on the ground Left arm resting comfortably on a flat surface at the level of your heart Legs uncrossed Back supported Sit quietly and don't talk Place the cuff on your bare arm Adjust snuggly, so that only two fingertips can fit between your skin and the top of the cuff Check 2  readings separated by at least one minute Keep a log of your BP readings For a visual, please reference this diagram: http://ccnc.care/bpdiagram  Provider Name: [facility]     Phone: [phone]  Zone 1: ALL CLEAR  Continue to monitor your symptoms:  BP reading is less than 140 (top number) or less than 90 (bottom number)  No right upper stomach pain No headaches or seeing spots No feeling nauseated or throwing up No swelling in face and hands  Zone 2: CAUTION Call your doctor's office for any of the following:  BP reading is greater than 140 (top number) or greater than 90 (bottom number)  Stomach pain under your ribs in the middle or right side Headaches or seeing spots Feeling nauseated or throwing up Swelling in face and hands  Zone 3: EMERGENCY  Seek immediate medical care if you have any of the following:  BP reading is greater than160 (top number) or greater than 110 (bottom number) Severe headaches not improving with Tylenol Serious difficulty catching your breath Any worsening symptoms from Zone 2   Second Trimester of Pregnancy The second trimester is from week 13 through week 28, months 4 through 6. The second trimester is often a time when you feel your best. Your body has also adjusted to being pregnant, and you begin to feel better physically. Usually, morning sickness has lessened or quit completely, you may have more energy, and you may have an increase in appetite. The second trimester is also a time when the fetus is growing rapidly. At the end of the sixth month, the fetus is about 9 inches long and weighs about 1 pounds. You will likely begin to feel the baby move (quickening) between 18 and 20 weeks of the pregnancy. BODY CHANGES Your body goes through many changes during pregnancy. The changes vary from woman to woman.  Your weight will continue to increase. You will notice your lower abdomen bulging out. You may begin to get stretch marks on your  hips, abdomen, and breasts. You may develop headaches that can be relieved by medicines approved by your health care provider. You may urinate more often because the fetus is pressing on your bladder. You may develop or continue to have heartburn as a result of your pregnancy. You may develop constipation because certain hormones are causing the muscles that push waste through your intestines to slow down. You may develop hemorrhoids or swollen, bulging veins (varicose veins). You may have back pain because of the weight gain and pregnancy hormones relaxing your joints between the bones in your pelvis and as a result of a shift in weight and the muscles that support your balance. Your breasts will continue to grow and be tender. Your gums may bleed and may be sensitive to brushing and flossing. Dark spots or blotches (chloasma, mask of pregnancy) may develop on your face. This will likely fade after the baby is born. A dark line from your belly button to the pubic area (linea nigra) may appear. This  will likely fade after the baby is born. You may have changes in your hair. These can include thickening of your hair, rapid growth, and changes in texture. Some women also have hair loss during or after pregnancy, or hair that feels dry or thin. Your hair will most likely return to normal after your baby is born. WHAT TO EXPECT AT YOUR PRENATAL VISITS During a routine prenatal visit: You will be weighed to make sure you and the fetus are growing normally. Your blood pressure will be taken. Your abdomen will be measured to track your baby's growth. The fetal heartbeat will be listened to. Any test results from the previous visit will be discussed. Your health care provider may ask you: How you are feeling. If you are feeling the baby move. If you have had any abnormal symptoms, such as leaking fluid, bleeding, severe headaches, or abdominal cramping. If you have any questions. Other tests that may  be performed during your second trimester include: Blood tests that check for: Low iron levels (anemia). Gestational diabetes (between 24 and 28 weeks). Rh antibodies. Urine tests to check for infections, diabetes, or protein in the urine. An ultrasound to confirm the proper growth and development of the baby. An amniocentesis to check for possible genetic problems. Fetal screens for spina bifida and Down syndrome. HOME CARE INSTRUCTIONS  Avoid all smoking, herbs, alcohol, and unprescribed drugs. These chemicals affect the formation and growth of the baby. Follow your health care provider's instructions regarding medicine use. There are medicines that are either safe or unsafe to take during pregnancy. Exercise only as directed by your health care provider. Experiencing uterine cramps is a good sign to stop exercising. Continue to eat regular, healthy meals. Wear a good support bra for breast tenderness. Do not use hot tubs, steam rooms, or saunas. Wear your seat belt at all times when driving. Avoid raw meat, uncooked cheese, cat litter boxes, and soil used by cats. These carry germs that can cause birth defects in the baby. Take your prenatal vitamins. Try taking a stool softener (if your health care provider approves) if you develop constipation. Eat more high-fiber foods, such as fresh vegetables or fruit and whole grains. Drink plenty of fluids to keep your urine clear or pale yellow. Take warm sitz baths to soothe any pain or discomfort caused by hemorrhoids. Use hemorrhoid cream if your health care provider approves. If you develop varicose veins, wear support hose. Elevate your feet for 15 minutes, 3-4 times a day. Limit salt in your diet. Avoid heavy lifting, wear low heel shoes, and practice good posture. Rest with your legs elevated if you have leg cramps or low back pain. Visit your dentist if you have not gone yet during your pregnancy. Use a soft toothbrush to brush your teeth  and be gentle when you floss. A sexual relationship may be continued unless your health care provider directs you otherwise. Continue to go to all your prenatal visits as directed by your health care provider. SEEK MEDICAL CARE IF:  You have dizziness. You have mild pelvic cramps, pelvic pressure, or nagging pain in the abdominal area. You have persistent nausea, vomiting, or diarrhea. You have a bad smelling vaginal discharge. You have pain with urination. SEEK IMMEDIATE MEDICAL CARE IF:  You have a fever. You are leaking fluid from your vagina. You have spotting or bleeding from your vagina. You have severe abdominal cramping or pain. You have rapid weight gain or loss. You have shortness of   breath with chest pain. You notice sudden or extreme swelling of your face, hands, ankles, feet, or legs. You have not felt your baby move in over an hour. You have severe headaches that do not go away with medicine. You have vision changes. Document Released: 09/11/2001 Document Revised: 09/22/2013 Document Reviewed: 11/18/2012 [facility] Patient Information 2015 [place], [place]. This information is not intended to replace advice given to you by your health care provider. Make sure you discuss any questions you have with your health care provider.

## 2021-08-23 NOTE — Progress Notes (Signed)
LOW-RISK PREGNANCY VISIT Patient name: Kerri Soto MRN [id number]  Date of birth: April 02, 1996 Chief Complaint:   Routine Prenatal Visit  History of Present Illness:   Kerri Soto is a 25 y.o. G57P1011 female at [redacted]w[redacted]d with an Estimated Date of Delivery: 12/09/21 being seen today for ongoing management of a low-risk pregnancy.   Today she reports  pain Rt lateral rib area x ~4wks. Does not radiate to back. Sometimes is worse after eating, but not always. Hurts when she hasn't just eaten as well. No n/v . Contractions: Not present. Vag. Bleeding: None.  Movement: Present. denies leaking of fluid.  Depression screen PHQ 2/9 05/30/2021  Decreased Interest 1  Down, Depressed, Hopeless 1  PHQ - 2 Score 2  Altered sleeping 1  Tired, decreased energy 1  Change in appetite 1  Feeling bad or failure about yourself  0  Trouble concentrating 0  Moving slowly or fidgety/restless 0  Suicidal thoughts 0  PHQ-9 Score 5     GAD 7 : Generalized Anxiety Score 05/30/2021  Nervous, Anxious, on Edge 0  Control/stop worrying 1  Worry too much - different things 1  Trouble relaxing 0  Restless 1  Easily annoyed or irritable 1  Afraid - awful might happen 0  Total GAD 7 Score 4      Review of Systems:   Pertinent items are noted in HPI Denies abnormal vaginal discharge w/ itching/odor/irritation, headaches, visual changes, shortness of breath, chest pain, abdominal pain, severe nausea/vomiting, or problems with urination or bowel movements unless otherwise stated above. Pertinent History Reviewed:  Reviewed past medical,surgical, social, obstetrical and family history.  Reviewed problem list, medications and allergies. Physical Assessment:   Vitals:   08/23/21 0836  BP: 112/72  Pulse: 95  Weight: 176 lb (79.8 kg)  Body mass index is 28.41 kg/m.        Physical Examination:   General appearance: Well appearing, and in no distress  Mental status: Alert, oriented to person,  place, and time  Skin: Warm & dry  Cardiovascular: Normal heart rate noted  Respiratory: Normal respiratory effort, no distress  Abdomen: Soft, gravid, nontender, no pain to palpation RUQ  Pelvic: Cervical exam deferred         Extremities: Edema: None  Fetal Status: Fetal Heart Rate (bpm): 148 Fundal Height: 24 cm Movement: Present    Chaperone: N/A   No results found for this or any previous visit (from the past 24 hour(s)).  Assessment & Plan:  1) Low-risk pregnancy G3P1011 at [redacted]w[redacted]d with an Estimated Date of Delivery: 12/09/21   2) Rt lateral rib pain, uncertain etiology, discussed possibilities of musculoskeletal pain, gallbladder, etc. If worsens or new sx develop let us know   Meds: No orders of the defined types were placed in this encounter.  Labs/procedures today: none  Plan:  Continue routine obstetrical care  Next visit: prefers will be in person for pn2     Reviewed: Preterm labor symptoms and general obstetric precautions including but not limited to vaginal bleeding, contractions, leaking of fluid and fetal movement were reviewed in detail with the patient.  All questions were answered. Does have home bp cuff. Office bp cuff given: not applicable. Check bp weekly, let us know if consistently >140 and/or >90.  Follow-up: Return in about 4 weeks (around 09/20/2021) for LROB, PN2, CNM, in person.  Future Appointments  Date Time Provider Department Center  09/20/2021  8:30 AM [facility]  09/20/2021  8:50 AM Arabella Merles, CNM [facility]    No orders of the defined types were placed in this encounter.  Cheral Marker CNM, [facility] 08/23/2021 8:57 AM

## 2021-08-29 ENCOUNTER — Encounter: Payer: Self-pay | Admitting: Women's Health

## 2021-09-20 ENCOUNTER — Inpatient Hospital Stay (HOSPITAL_COMMUNITY): Payer: Medicaid Other | Admitting: Anesthesiology

## 2021-09-20 ENCOUNTER — Inpatient Hospital Stay (HOSPITAL_COMMUNITY)
Admission: AD | Admit: 2021-09-20 | Discharge: 2021-09-21 | DRG: 806 | Disposition: A | Discharge status: Home / Self Care | Payer: Medicaid Other | Attending: Family Medicine | Admitting: Family Medicine

## 2021-09-20 ENCOUNTER — Other Ambulatory Visit: Payer: Self-pay

## 2021-09-20 ENCOUNTER — Ambulatory Visit (INDEPENDENT_AMBULATORY_CARE_PROVIDER_SITE_OTHER): Payer: Medicaid Other | Admitting: *Deleted

## 2021-09-20 ENCOUNTER — Encounter (HOSPITAL_COMMUNITY): Payer: Self-pay | Admitting: Obstetrics and Gynecology

## 2021-09-20 ENCOUNTER — Encounter: Payer: Medicaid Other | Admitting: Advanced Practice Midwife

## 2021-09-20 ENCOUNTER — Inpatient Hospital Stay (HOSPITAL_BASED_OUTPATIENT_CLINIC_OR_DEPARTMENT_OTHER): Payer: Medicaid Other

## 2021-09-20 ENCOUNTER — Telehealth: Payer: Self-pay

## 2021-09-20 ENCOUNTER — Other Ambulatory Visit: Payer: Medicaid Other

## 2021-09-20 DIAGNOSIS — O368330 Maternal care for abnormalities of the fetal heart rate or rhythm, third trimester, not applicable or unspecified: Secondary | ICD-10-CM | POA: Diagnosis not present

## 2021-09-20 DIAGNOSIS — O364XX0 Maternal care for intrauterine death, not applicable or unspecified: Secondary | ICD-10-CM | POA: Diagnosis present

## 2021-09-20 DIAGNOSIS — O26899 Other specified pregnancy related conditions, unspecified trimester: Secondary | ICD-10-CM

## 2021-09-20 DIAGNOSIS — Z3A28 28 weeks gestation of pregnancy: Secondary | ICD-10-CM | POA: Diagnosis not present

## 2021-09-20 DIAGNOSIS — O99324 Drug use complicating childbirth: Secondary | ICD-10-CM | POA: Diagnosis present

## 2021-09-20 DIAGNOSIS — R87619 Unspecified abnormal cytological findings in specimens from cervix uteri: Secondary | ICD-10-CM | POA: Diagnosis present

## 2021-09-20 DIAGNOSIS — R768 Other specified abnormal immunological findings in serum: Secondary | ICD-10-CM | POA: Diagnosis present

## 2021-09-20 DIAGNOSIS — Z3480 Encounter for supervision of other normal pregnancy, unspecified trimester: Secondary | ICD-10-CM

## 2021-09-20 DIAGNOSIS — Z3490 Encounter for supervision of normal pregnancy, unspecified, unspecified trimester: Secondary | ICD-10-CM

## 2021-09-20 DIAGNOSIS — Z87891 Personal history of nicotine dependence: Secondary | ICD-10-CM | POA: Diagnosis not present

## 2021-09-20 DIAGNOSIS — O364XX1 Maternal care for intrauterine death, fetus 1: Secondary | ICD-10-CM | POA: Diagnosis not present

## 2021-09-20 DIAGNOSIS — O26893 Other specified pregnancy related conditions, third trimester: Secondary | ICD-10-CM | POA: Diagnosis present

## 2021-09-20 DIAGNOSIS — F1290 Cannabis use, unspecified, uncomplicated: Secondary | ICD-10-CM | POA: Diagnosis present

## 2021-09-20 DIAGNOSIS — Z20822 Contact with and (suspected) exposure to covid-19: Secondary | ICD-10-CM | POA: Diagnosis present

## 2021-09-20 DIAGNOSIS — Z6791 Unspecified blood type, Rh negative: Secondary | ICD-10-CM | POA: Diagnosis not present

## 2021-09-20 LAB — TYPE AND SCREEN
ABO/RH(D): A NEG
Antibody Screen: NEGATIVE

## 2021-09-20 LAB — CBC
HCT: 35.2 % — ABNORMAL LOW (ref 36.0–46.0)
Hemoglobin: 11.7 g/dL — ABNORMAL LOW (ref 12.0–15.0)
MCH: 30.7 pg (ref 26.0–34.0)
MCHC: 33.2 g/dL (ref 30.0–36.0)
MCV: 92.4 fL (ref 80.0–100.0)
Platelets: 217 10*3/uL (ref 150–400)
RBC: 3.81 MIL/uL — ABNORMAL LOW (ref 3.87–5.11)
RDW: 14 % (ref 11.5–15.5)
WBC: 10.4 10*3/uL (ref 4.0–10.5)
nRBC: 0 % (ref 0.0–0.2)

## 2021-09-20 LAB — RESP PANEL BY RT-PCR (FLU A&B, COVID) ARPGX2
Influenza A by PCR: NEGATIVE
Influenza B by PCR: NEGATIVE
SARS Coronavirus 2 by RT PCR: NEGATIVE

## 2021-09-20 MED ORDER — FENTANYL-BUPIVACAINE-NACL 0.5-0.125-0.9 MG/250ML-% EP SOLN
12.0000 mL/h | EPIDURAL | Status: DC | PRN
  Administered 2021-09-20: 22:00:00 12 mL/h via EPIDURAL
  Filled 2021-09-20: qty 250

## 2021-09-20 MED ORDER — MISOPROSTOL 200 MCG PO TABS
600.0000 ug | ORAL_TABLET | Freq: Once | ORAL | Status: AC
  Administered 2021-09-20: 20:00:00 600 ug via VAGINAL

## 2021-09-20 MED ORDER — LACTATED RINGERS IV SOLN: INTRAVENOUS | Status: DC

## 2021-09-20 MED ORDER — PHENYLEPHRINE 40 MCG/ML (10ML) SYRINGE FOR IV PUSH (FOR BLOOD PRESSURE SUPPORT)
80.0000 ug | PREFILLED_SYRINGE | INTRAVENOUS | Status: DC | PRN
  Filled 2021-09-20: qty 10

## 2021-09-20 MED ORDER — LACTATED RINGERS IV SOLN: 500.0000 mL | INTRAVENOUS | Status: DC | PRN

## 2021-09-20 MED ORDER — LIDOCAINE HCL (PF) 1 % IJ SOLN
INTRAMUSCULAR | Status: DC | PRN
  Administered 2021-09-20: 3 mL via EPIDURAL
  Administered 2021-09-20: 5 mL via EPIDURAL

## 2021-09-20 MED ORDER — PHENYLEPHRINE 40 MCG/ML (10ML) SYRINGE FOR IV PUSH (FOR BLOOD PRESSURE SUPPORT): 80.0000 ug | PREFILLED_SYRINGE | INTRAVENOUS | Status: DC | PRN

## 2021-09-20 MED ORDER — OXYTOCIN BOLUS FROM INFUSION
333.0000 mL | Freq: Once | INTRAVENOUS | Status: AC
  Administered 2021-09-21: 06:00:00 333 mL via INTRAVENOUS

## 2021-09-20 MED ORDER — EPHEDRINE 5 MG/ML INJ: 10.0000 mg | INTRAVENOUS | Status: DC | PRN

## 2021-09-20 MED ORDER — OXYCODONE-ACETAMINOPHEN 5-325 MG PO TABS: 1.0000 | ORAL_TABLET | ORAL | Status: DC | PRN

## 2021-09-20 MED ORDER — FENTANYL CITRATE (PF) 100 MCG/2ML IJ SOLN
50.0000 ug | INTRAMUSCULAR | Status: DC | PRN
  Administered 2021-09-20: 22:00:00 50 ug via INTRAVENOUS
  Filled 2021-09-20: qty 2

## 2021-09-20 MED ORDER — DIPHENHYDRAMINE HCL 50 MG/ML IJ SOLN: 12.5000 mg | INTRAMUSCULAR | Status: DC | PRN

## 2021-09-20 MED ORDER — ACETAMINOPHEN 325 MG PO TABS: 650.0000 mg | ORAL_TABLET | ORAL | Status: DC | PRN

## 2021-09-20 MED ORDER — OXYCODONE-ACETAMINOPHEN 5-325 MG PO TABS: 2.0000 | ORAL_TABLET | ORAL | Status: DC | PRN

## 2021-09-20 MED ORDER — MISOPROSTOL 200 MCG PO TABS: 400.0000 ug | ORAL_TABLET | Freq: Once | ORAL | Status: AC

## 2021-09-20 MED ORDER — LIDOCAINE HCL (PF) 1 % IJ SOLN: 30.0000 mL | INTRAMUSCULAR | Status: DC | PRN

## 2021-09-20 MED ORDER — MISOPROSTOL 200 MCG PO TABS
ORAL_TABLET | ORAL | Status: AC
  Administered 2021-09-20: 20:00:00 400 ug via ORAL
  Filled 2021-09-20: qty 5

## 2021-09-20 MED ORDER — LACTATED RINGERS IV SOLN
500.0000 mL | Freq: Once | INTRAVENOUS | Status: AC
  Administered 2021-09-20: 22:00:00 500 mL via INTRAVENOUS

## 2021-09-20 MED ORDER — SOD CITRATE-CITRIC ACID 500-334 MG/5ML PO SOLN: 30.0000 mL | ORAL | Status: DC | PRN

## 2021-09-20 MED ORDER — MISOPROSTOL 200 MCG PO TABS: 400.0000 ug | ORAL_TABLET | Freq: Once | ORAL | Status: DC

## 2021-09-20 MED ORDER — OXYTOCIN-SODIUM CHLORIDE 30-0.9 UT/500ML-% IV SOLN
2.5000 [IU]/h | INTRAVENOUS | Status: DC
  Filled 2021-09-20 (×2): qty 500

## 2021-09-20 MED ORDER — ONDANSETRON HCL 4 MG/2ML IJ SOLN: 4.0000 mg | Freq: Four times a day (QID) | INTRAMUSCULAR | Status: DC | PRN

## 2021-09-20 MED ORDER — LACTATED RINGERS IV SOLN: 500.0000 mL | Freq: Once | INTRAVENOUS | Status: DC

## 2021-09-20 NOTE — Anesthesia Procedure Notes (Signed)
Epidural Patient location during procedure: OB Start time: 09/20/2021 10:05 PM End time: 09/20/2021 10:17 PM  Staffing Anesthesiologist: Achille Rich, MD Performed: anesthesiologist   Preanesthetic Checklist Completed: patient identified, IV checked, site marked, risks and benefits discussed, monitors and equipment checked, pre-op evaluation and timeout performed  Epidural Patient position: sitting Prep: DuraPrep Patient monitoring: heart rate, cardiac monitor, continuous pulse ox and blood pressure Approach: midline Location: L2-L3 Injection technique: LOR saline  Needle:  Needle type: Tuohy  Needle gauge: 17 G Needle length: 9 cm Needle insertion depth: 5 cm Catheter type: closed end flexible Catheter size: 19 Gauge Catheter at skin depth: 11 cm Test dose: negative and Other  Assessment Events: blood not aspirated, injection not painful, no injection resistance and negative IV test  Additional Notes Informed consent obtained prior to proceeding including risk of failure, 1% risk of PDPH, risk of minor discomfort and bruising.  Discussed rare but serious complications including epidural abscess, permanent nerve injury, epidural hematoma.  Discussed alternatives to epidural analgesia and patient desires to proceed.  Timeout performed pre-procedure verifying patient name, procedure, and platelet count.  Patient tolerated procedure well. Reason for block:procedure for pain

## 2021-09-20 NOTE — Progress Notes (Signed)
° °  NURSE VISIT- NST  SUBJECTIVE:  Kerri Soto is a 25 y.o. G62P1011 female at [redacted]w[redacted]d, here for a NST for pregnancy complicated by Decreased fetal movement.  She reports decreased  fetal movement for 2 days, contractions: none, vaginal bleeding: none, membranes: intact.   OBJECTIVE:  BP 110/70    Pulse 87    LMP 03/04/2021 (Exact Date)   Appears well, no apparent distress  ASSESSMENT: G3P1011 at [redacted]w[redacted]d with Decreased fetal movement Unable to obtain fetal heart tones by NST, confirmed by bedside ultrasound by Dr Charlotta Newton  PLAN: Recommendations: send to [facility] MAU Kerri Soto, CNM notified by Dr Denman George Kerri Soto  09/20/2021 12:12 PM  Chart reviewed for nurse visit. Agree with plan of care.  Myna Hidalgo, DO 09/23/2021 11:49 AM

## 2021-09-20 NOTE — MAU Note (Signed)
Ultrasonographer at bedside. 

## 2021-09-20 NOTE — MAU Note (Signed)
Patient's dietary tray has arrived.

## 2021-09-20 NOTE — H&P (Signed)
OBSTETRIC ADMISSION HISTORY AND PHYSICAL  Kerri Soto is a 25 y.o. female G3P1011 with IUP at [redacted]w[redacted]d by LMP presenting for IOL due to IUFD. She reports having decreased fetal movement recently and came to the [facility] office for further evaluation. FHT were unable to be confirmed so she was sent to MAU for further evaluation. An ultrasound was performed in MAU which revealed absent cardiac activity, consistent with IUFD. Patient was counseled on IOL process. Patient was ultimately agreeable to admission and proceeding with IOL today.   She received her prenatal care at [facility].   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   Nexplanon removal 10/22/2013    Past Surgical History: Past Surgical History:  Procedure Laterality Date   NO PAST SURGERIES      Obstetrical History: OB History     Gravida  3   Para  1   Term  1   Preterm      AB  1   Living  1      SAB  1   IAB      Ectopic      Multiple      Live Births  1           Social History Social History   Socioeconomic History   Marital status: Significant Other    Spouse name: Not on file   Number of children: 1   Years of education: Not on file   Highest education level: Not on file  Occupational History   Not on file  Tobacco Use   Smoking status: Former    Packs/day: 0.50    Years: 1.00    Pack years: 0.50    Types: Cigarettes    Quit date: 05/17/2021    Years since quitting: 0.3   Smokeless tobacco: Never  Vaping Use   Vaping Use: Never used  Substance and Sexual Activity   Alcohol use: No   Drug use: Not Currently    Types:  Marijuana   Sexual activity: Yes    Birth control/protection: None  Other Topics Concern   Not on file  Social History Narrative   Not on file   Social Determinants of Health   Financial Resource Strain: Low Risk    Difficulty of Paying Living Expenses: Not hard at all  Food Insecurity: No Food Insecurity   Worried About 05/19/2021 in the Last Year: Never true   Ran Out of Food in the Last Year: Never true  Transportation Needs: No Transportation Needs   Lack of Transportation (Medical): No   Lack of Transportation (Non-Medical): No  Physical Activity: Inactive   Days of Exercise per Week: 0 days   Minutes of Exercise per Session: 0 min  Stress: No Stress Concern Present   Feeling of Stress : Only a little  Social Connections: Moderately Isolated   Frequency of Communication with Friends and Family: More than three times a week   Frequency of Social Gatherings with Friends and Family: Never   Attends Religious Services: 1 to 4 times per year   Active Member of Programme researcher, broadcasting/film/video or Organizations: No   Attends [employer]  Meetings: Never   Marital Status: Never married    Family History: Family History  Problem Relation Age of Onset   Diabetes Father    Hypertension Father    Hyperlipidemia Father    Diabetes Maternal Grandmother    Diabetes Paternal Grandmother    Dementia Paternal Grandmother     Allergies: No Known Allergies  Medications Prior to Admission  Medication Sig Dispense Refill Last Dose   Blood Pressure Monitor MISC For regular home bp monitoring during pregnancy 1 each 0    Prenatal Vit-Fe Fumarate-FA (PRENATAL VITAMIN PO) Take by mouth.        Review of Systems  All systems reviewed and negative except as stated in HPI  Blood pressure 116/65, pulse 88, temperature 99.8 F (37.7 C), temperature source Oral, resp. rate 17, last menstrual period 03/04/2021, SpO2 100 %.  General appearance: alert, cooperative, and no distress Lungs: normal  work of breathing on room air  Heart: normal rate, warm and well perfused  Abdomen: soft, non-tender, gravid  Extremities: no LE edema or calf tenderness to palpation   Presentation: Cephalic by [place] Dilation: Closed Effacement (%): Thick Station: Ballotable Exam by:: C  Prenatal labs: ABO, Rh: A/Negative/-- (08/30 1149) Antibody: Negative (08/30 1149) Rubella: 2.35 (08/30 1149) RPR: Non Reactive (08/30 1149)  HBsAg: Negative (08/30 1149)  HIV: Non Reactive (08/30 1149)  GBS:  Unknown  2 hr Glucola - not done  Genetic screening - LR NIPS, Horizon neg  Anatomy US normal   Prenatal Transfer Tool  Maternal Diabetes: Unknown  Genetic Screening: Normal Maternal Ultrasounds/Referrals: Normal Fetal Ultrasounds or other Referrals:  None Maternal Substance Abuse:  Yes:  Type: Marijuana Significant Maternal Medications:  None Significant Maternal Lab Results: Rh negative  No results found for this or any previous visit (from the past 24 hour(s)).  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 G3P1011 at [redacted]w[redacted]d here for IOL due to IUFD.   #Fetal demise: Considering genetic testing and autopsy, though not completely sure at this time. Discussed options with patient at bedside. Patient is still considering how she would like her delivery experience to be. Counseled that we can accommodate her needs in any way. Will continue to discuss.   #Labor: Induction started with 400 mcg oral Cytotec and 600 mcg vaginal Cytotec. Will reassess in 3-4 hours, sooner if patient feels pressure or urge to push. #Pain: PRN; planning for epidural when ready  #ID:  No signs/symptoms of  infection at this time. Will continue to monitor.   #Rh negative: Will plan for Rhogam postpartum.    Worthy Rancher, MD  09/20/2021, 2:28 PM

## 2021-09-20 NOTE — Telephone Encounter (Signed)
Once alerted that the pt had not felt any baby movement since Monday despite efforts, she was immediately brought in for a NST.

## 2021-09-20 NOTE — MAU Provider Note (Signed)
History     CSN: [id number]  Arrival date and time: 09/20/21 1242  Event Date/Time  First Provider Initiated Contact with Patient 09/20/21 1254      Chief Complaint  Patient presents with   Decreased Fetal Movement   no fetal heart rate   HPI Kerri Soto is a 25 y.o. G3P1011 at 75w4dwho [facility]. Patient reports decreased fetal movement x 2 days. She denies pain, bleeding on arrival to MAU.  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   Medical history non-contributory    Nexplanon removal 10/22/2013    Past Surgical History:  Procedure Laterality Date   NO PAST SURGERIES      Family History  Problem Relation Age of Onset   Diabetes Father    Hypertension Father    Hyperlipidemia Father    Diabetes Maternal Grandmother    Diabetes Paternal Grandmother    Dementia Paternal Grandmother     Social History   Tobacco Use   Smoking status: Former    Packs/day: 0.50    Years: 1.00    Pack years: 0.50    Types: Cigarettes    Quit date: 05/17/2021    Years since quitting: 0.3   Smokeless tobacco: Never  Vaping Use   Vaping Use: Never used  Substance Use Topics   Alcohol use: No   Drug use: Not Currently    Types: Marijuana    Allergies: No Known Allergies  Medications Prior to Admission  Medication Sig Dispense Refill Last Dose   Blood Pressure Monitor MISC For regular home bp monitoring during pregnancy 1 each 0    Prenatal Vit-Fe Fumarate-FA (PRENATAL VITAMIN PO) Take by mouth.       Review of Systems  Gastrointestinal:  Negative for abdominal pain.  Genitourinary:  Negative for vaginal bleeding.  All other systems reviewed and are  negative. Physical Exam   Blood pressure 123/75, pulse 90, temperature 99.8 F (37.7 C), temperature source Oral, resp. rate 17, last menstrual period 03/04/2021, SpO2 100 %.  Physical Exam Vitals and nursing note reviewed. Exam conducted with a chaperone present.  Constitutional:      Appearance: Normal appearance.  Cardiovascular:     Rate and Rhythm: Normal rate.     Pulses: Normal pulses.  Pulmonary:     Effort: Pulmonary effort is normal.  Abdominal:     Comments: Gravid  Skin:    Capillary Refill: Capillary refill takes less than 2 seconds.  Neurological:     Mental Status: She is alert and oriented to person, place, and time.  Psychiatric:        Mood and Affect: Mood normal.        Behavior: Behavior normal.        Thought Content: Thought content normal.        Judgment: Judgment normal.    MAU Course/MDM  Procedures  --CNM met patient on arrival to MAU. Discussed report received  from Dr. Nelda Marseille, plan for confirmatory ultrasound  --Ultrasound confirms demise. Discussed with Dr. Ernestina Patches. Patient appropriate for IOL today, this evening or tomorrow depending on her preferences and grieving  --66: CNM returned to bedside to confirm fetal demise. Patient given time to process. Will push call bell when ready to talk  --CNM returned to bedside. Patient, her mother and FOB at bedside. Discussed recommendation for IOL and options for timing of induction. Patient requests admission now.   Orders Placed This Encounter  Procedures   [place] [facility] Limited   Patient Vitals for the past 24 hrs:  BP Temp Temp src Pulse Resp SpO2  09/20/21 1314 116/65 -- -- 88 -- 100 %  09/20/21 1309 123/75 99.8 F (37.7 C) Oral 90 17 100 %   Assessment and Plan  --25 y.o. G3P1011 with confirmed fetal demise at [redacted]w[redacted]d--Pt elects IOL today --Report called to Dr. AGwenlyn Perking--Admit to L&D  SDarlina Rumpf CNM 09/20/2021, 2:23 PM

## 2021-09-20 NOTE — Telephone Encounter (Signed)
Patient called stating that she hasn't felt the baby move since Monday 09/18/21 states she is 28 weeks, and that normally after she eats she can feel movement but no  movement has been felt and she even tried pushing on her belly and a flash light.(She states she read on the internet) patients to know if someone could see her today.

## 2021-09-20 NOTE — Anesthesia Preprocedure Evaluation (Signed)
Anesthesia Evaluation  Patient identified by MRN, date of birth, ID band Patient awake    Reviewed: Allergy & Precautions, H&P , NPO status , Patient's Chart, lab work & pertinent test results  Airway Mallampati: II   Neck ROM: full    Dental   Pulmonary former smoker,    breath sounds clear to auscultation       Cardiovascular negative cardio ROS   Rhythm:regular Rate:Normal     Neuro/Psych    GI/Hepatic   Endo/Other    Renal/GU      Musculoskeletal   Abdominal   Peds  Hematology   Anesthesia Other Findings   Reproductive/Obstetrics IUFD                             Anesthesia Physical Anesthesia Plan  ASA: 1  Anesthesia Plan: Epidural   Post-op Pain Management:    Induction: Intravenous  PONV Risk Score and Plan: 2 and Treatment may vary due to age or medical condition  Airway Management Planned: Natural Airway  Additional Equipment:   Intra-op Plan:   Post-operative Plan:   Informed Consent: I have reviewed the patients History and Physical, chart, labs and discussed the procedure including the risks, benefits and alternatives for the proposed anesthesia with the patient or authorized representative who has indicated his/her understanding and acceptance.     Dental advisory given  Plan Discussed with: Anesthesiologist  Anesthesia Plan Comments:         Anesthesia Quick Evaluation

## 2021-09-20 NOTE — MAU Note (Signed)
...  Kerri Soto is a 25 y.o. at [redacted]w[redacted]d here in MAU reporting: sent over from her OB/GYN for absence of fetal heart rate on U/S. Denies pain, VB, or LOF.  Thalia Bloodgood, CNM, at bedside immediately upon patients arrival to MAU.

## 2021-09-21 ENCOUNTER — Encounter (HOSPITAL_COMMUNITY): Payer: Self-pay | Admitting: Family Medicine

## 2021-09-21 DIAGNOSIS — O364XX1 Maternal care for intrauterine death, fetus 1: Secondary | ICD-10-CM

## 2021-09-21 DIAGNOSIS — O99324 Drug use complicating childbirth: Secondary | ICD-10-CM

## 2021-09-21 DIAGNOSIS — Z3A28 28 weeks gestation of pregnancy: Secondary | ICD-10-CM

## 2021-09-21 LAB — KLEIHAUER-BETKE STAIN
# Vials RhIg: 1
Fetal Cells %: 0 %
Quantitation Fetal Hemoglobin: 0 mL

## 2021-09-21 LAB — RPR: RPR Ser Ql: NONREACTIVE

## 2021-09-21 MED ORDER — IBUPROFEN 600 MG PO TABS: 600.0000 mg | ORAL_TABLET | Freq: Four times a day (QID) | ORAL | Status: DC

## 2021-09-21 MED ORDER — TETANUS-DIPHTH-ACELL PERTUSSIS 5-2.5-18.5 LF-MCG/0.5 IM SUSY: 0.5000 mL | PREFILLED_SYRINGE | Freq: Once | INTRAMUSCULAR | Status: DC

## 2021-09-21 MED ORDER — MISOPROSTOL 200 MCG PO TABS
600.0000 ug | ORAL_TABLET | Freq: Once | ORAL | Status: AC
Start: 2021-09-21 — End: 2021-09-21

## 2021-09-21 MED ORDER — MISOPROSTOL 200 MCG PO TABS
ORAL_TABLET | ORAL | Status: AC
  Administered 2021-09-21: 05:00:00 600 ug via VAGINAL
  Filled 2021-09-21: qty 1

## 2021-09-21 MED ORDER — MISOPROSTOL 200 MCG PO TABS: 600.0000 ug | ORAL_TABLET | Freq: Once | ORAL | Status: AC

## 2021-09-21 MED ORDER — MISOPROSTOL 200 MCG PO TABS
ORAL_TABLET | ORAL | Status: AC
  Filled 2021-09-21: qty 4

## 2021-09-21 MED ORDER — SIMETHICONE 80 MG PO CHEW: 80.0000 mg | CHEWABLE_TABLET | ORAL | Status: DC | PRN

## 2021-09-21 MED ORDER — ONDANSETRON HCL 4 MG/2ML IJ SOLN: 4.0000 mg | INTRAMUSCULAR | Status: DC | PRN

## 2021-09-21 MED ORDER — MISOPROSTOL 200 MCG PO TABS: 800.0000 ug | ORAL_TABLET | Freq: Once | ORAL | Status: AC

## 2021-09-21 MED ORDER — BENZOCAINE-MENTHOL 20-0.5 % EX AERO: 1.0000 "application " | INHALATION_SPRAY | CUTANEOUS | Status: DC | PRN

## 2021-09-21 MED ORDER — DIPHENHYDRAMINE HCL 25 MG PO CAPS: 25.0000 mg | ORAL_CAPSULE | Freq: Four times a day (QID) | ORAL | Status: DC | PRN

## 2021-09-21 MED ORDER — MISOPROSTOL 200 MCG PO TABS: 400.0000 ug | ORAL_TABLET | Freq: Once | ORAL | Status: AC

## 2021-09-21 MED ORDER — ONDANSETRON HCL 4 MG PO TABS: 4.0000 mg | ORAL_TABLET | ORAL | Status: DC | PRN

## 2021-09-21 MED ORDER — WITCH HAZEL-GLYCERIN EX PADS: 1.0000 "application " | MEDICATED_PAD | CUTANEOUS | Status: DC | PRN

## 2021-09-21 MED ORDER — RHO D IMMUNE GLOBULIN 1500 UNIT/2ML IJ SOSY
300.0000 ug | PREFILLED_SYRINGE | Freq: Once | INTRAMUSCULAR | Status: AC
Start: 2021-09-21 — End: 2021-09-21
  Administered 2021-09-21: 16:00:00 300 ug via INTRAVENOUS
  Filled 2021-09-21: qty 2

## 2021-09-21 MED ORDER — DIBUCAINE (PERIANAL) 1 % EX OINT: 1.0000 "application " | TOPICAL_OINTMENT | CUTANEOUS | Status: DC | PRN

## 2021-09-21 MED ORDER — MISOPROSTOL 200 MCG PO TABS
ORAL_TABLET | ORAL | Status: AC
  Administered 2021-09-21: 05:00:00 400 ug via ORAL
  Filled 2021-09-21: qty 3

## 2021-09-21 MED ORDER — SENNOSIDES-DOCUSATE SODIUM 8.6-50 MG PO TABS: 2.0000 | ORAL_TABLET | Freq: Every day | ORAL | Status: DC

## 2021-09-21 MED ORDER — MISOPROSTOL 200 MCG PO TABS
ORAL_TABLET | ORAL | Status: AC
  Administered 2021-09-21: 600 ug via VAGINAL
  Filled 2021-09-21: qty 3

## 2021-09-21 MED ORDER — ACETAMINOPHEN 325 MG PO TABS: 650.0000 mg | ORAL_TABLET | ORAL | Status: DC | PRN

## 2021-09-21 MED ORDER — MISOPROSTOL 200 MCG PO TABS
ORAL_TABLET | ORAL | Status: AC
  Administered 2021-09-21: 08:00:00 800 ug
  Filled 2021-09-21: qty 2

## 2021-09-21 MED ORDER — MEDROXYPROGESTERONE ACETATE 150 MG/ML IM SUSP: 150.0000 mg | INTRAMUSCULAR | Status: DC | PRN

## 2021-09-21 MED ORDER — COCONUT OIL OIL: 1.0000 "application " | TOPICAL_OIL | Status: DC | PRN

## 2021-09-21 MED ORDER — PRENATAL MULTIVITAMIN CH: 1.0000 | ORAL_TABLET | Freq: Every day | ORAL | Status: DC

## 2021-09-21 MED ORDER — IBUPROFEN 600 MG PO TABS: 600.0000 mg | ORAL_TABLET | Freq: Four times a day (QID) | ORAL | 0 refills | Status: DC

## 2021-09-21 MED ORDER — MEASLES, MUMPS & RUBELLA VAC IJ SOLR: 0.5000 mL | Freq: Once | INTRAMUSCULAR | Status: DC

## 2021-09-21 MED ORDER — IBUPROFEN 600 MG PO TABS
600.0000 mg | ORAL_TABLET | Freq: Once | ORAL | Status: AC
  Administered 2021-09-21: 09:00:00 600 mg via ORAL
  Filled 2021-09-21: qty 1

## 2021-09-21 NOTE — Anesthesia Postprocedure Evaluation (Signed)
Anesthesia Post Note  Patient: Kerri Soto  Procedure(s) Performed: AN AD HOC LABOR EPIDURAL     Patient location during evaluation: Mother Baby Anesthesia Type: Epidural Level of consciousness: awake Pain management: satisfactory to patient Vital Signs Assessment: post-procedure vital signs reviewed and stable Respiratory status: spontaneous breathing Cardiovascular status: stable Anesthetic complications: no   No notable events documented.  Last Vitals:  Vitals:   09/21/21 1004 09/21/21 1113  BP: 115/66 (!) 110/50  Pulse: 85 83  Resp: 16 16  Temp: 36.9 C 36.7 C  SpO2: 100% 100%    Last Pain:  Vitals:   09/21/21 1456  TempSrc:   PainSc: 0-No pain   Pain Goal:                   [employer]

## 2021-09-21 NOTE — Progress Notes (Signed)
Discharge instructions and prescriptions given to pt. Discussed post vaginal delivery care, signs and symptoms to report to the MD, upcoming appointments, and meds.Pt verbalizes understanding and has no questions or concerns at this time. Pt discharged home from hospital in stable condition. 

## 2021-09-21 NOTE — Discharge Summary (Signed)
Postpartum Discharge Summary     Patient Name: Kerri Soto DOB: [birth year] MRN: [id number]  Date of admission: 09/20/2021 Delivery date:09/21/2021  Delivering provider: Warner Mccreedy  Date of discharge: 09/21/2021  Admitting diagnosis: IUFD at 20 weeks or more of gestation [O36.4XX0] Intrauterine pregnancy: [redacted]w[redacted]d     Secondary diagnosis:  Principal Problem:   IUFD at 20 weeks or more of gestation Active Problems:   HSV-2 seropositive   Encounter for supervision of normal pregnancy, antepartum   Rh negative state in antepartum period   Abnormal Pap smear of cervix  Additional problems: None    Discharge diagnosis:  IUFD delivered                                               Post partum procedures:rhogam Augmentation: Cytotec Com[facility]: Induction of Labor With Vaginal Delivery   25 y.o. yo G3P1011 at [redacted]w[redacted]d was admitted to the hospital 09/20/2021 for induction of labor.  Indication for induction:  IUFD at 28 weeks .  Patient was induced using vaginal and oral cytotec Membrane Rupture Time/Date: 5:26 AM ,09/21/2021   Delivery Method:Vaginal, Spontaneous  Episiotomy: None  Lacerations:  None  Details of delivery can be found in separate delivery note.  Patient's post partum course uncomplicated. Patient remained hemodynamically stable and requested to be discharged. Patient is discharged home 09/21/21.  Newborn Data: Birth date:09/21/2021  Birth time:5:26 AM  Gender:Female  Living status:Fetal Demise  Apgars: ,  Weight:992 g   Magnesium Sulfate received: No BMZ received: No   Physical exam  Vitals:   09/21/21 0831 09/21/21 1004 09/21/21 1113 09/21/21 1300  BP: 104/71 115/66 (!) 110/50   Pulse: 95 85 83   Resp: 16 16 16    Temp:  98.5 F (36.9 C) 98 F (36.7 C)   TempSrc:  Oral Oral   SpO2:  100% 100%   Weight:    81.2 kg   General: alert, cooperative, and no distress Lochia: appropriate Uterine Fundus: firm Incision:  N/A DVT Evaluation: No evidence of DVT seen on physical exam. Labs: Lab Results  Component Value Date   WBC 10.4 09/20/2021   HGB 11.7 (L) 09/20/2021   HCT 35.2 (L) 09/20/2021   MCV 92.4 09/20/2021   PLT 217 09/20/2021   CMP Latest Ref Rng & Units 02/14/2021  Glucose 70 - 99 mg/dL 79  BUN 6 - 20 mg/dL 20  Creatinine 02/16/2021 - 6.96 mg/dL 2.95  Sodium 2.84 - 132 mmol/L 135  Potassium 3.5 - 5.1 mmol/L 3.2(L)  Chloride 98 - 111 mmol/L 97(L)  CO2 22 - 32 mmol/L 24  Calcium 8.9 - 10.3 mg/dL 9.3  Total Protein 6.5 - 8.1 g/dL 440)  Total Bilirubin 0.3 - 1.2 mg/dL 1.0(U)  Alkaline Phos 38 - 126 U/L 61  AST 15 - 41 U/L 21  ALT 0 - 44 U/L 16   Edinburgh Score: No flowsheet data found.   After visit meds:  Allergies as of 09/21/2021   No Known Allergies      Medication List     STOP taking these medications    Blood Pressure Monitor Misc       TAKE these medications    ibuprofen 600 MG tablet Commonly known as: ADVIL Take 1 tablet (600 mg total) by mouth every 6 (six) hours.   PRENATAL VITAMIN  PO Take by mouth.         Discharge home in stable condition Infant Disposition:morgue Discharge instruction: per After Visit Summary and Postpartum booklet. Activity: Advance as tolerated. Pelvic rest for 6 weeks.  Diet: routine diet Future Appointments: Future Appointments  Date Time Provider Department Center  09/28/2021  1:30 PM Myna Hidalgo, DO [facility]  10/19/2021  1:30 PM Cheral Marker, CNM [facility]   Follow up Visit:  Follow-up Information     [facility] Follow up on 09/28/2021.   Why: An appointment has been scheduled for you Contact information: [address] [place] [address] [phone]               Message sent to [facility] by Dr. Ephriam Jenkins on 09/21/21  Please schedule this patient for a In person postpartum visit in 4 weeks with the following provider: Any provider. Additional Postpartum  F/U:Postpartum Depression checkup  High risk pregnancy complicated by:  IUFD Delivery mode:  Vaginal, Spontaneous  Anticipated Birth Control:  POPs   09/21/2021 Catalina Antigua, MD

## 2021-09-21 NOTE — Progress Notes (Signed)
Kerri Soto is a 25 y.o. G3P1011 at [redacted]w[redacted]d admitted for IOL for IUFD  Subjective: Comfortable with epidural.  Objective: BP 110/65    Pulse 78    Temp 98.6 F (37 C)    Resp 16    LMP 03/04/2021 (Exact Date)    SpO2 98%  No intake/output data recorded. No intake/output data recorded.  SVE:   Dilation: 2 Effacement (%): Thick Station: -3 Exam by:: Warner Mccreedy  Labs: Lab Results  Component Value Date   WBC 10.4 09/20/2021   HGB 11.7 (L) 09/20/2021   HCT 35.2 (L) 09/20/2021   MCV 92.4 09/20/2021   PLT 217 09/20/2021    Assessment / Plan:  25 y.o. G3P1011 at [redacted]w[redacted]d here for IOL due to IUFDCervix dilated from 0 cm to 2 cm. Still thick and -3. Discussed plan with Dr. Alysia Penna and placed additional 600 mcg vaginal cytotec. Reassess in 4 hours.    Warner Mccreedy, MD, MPH OB Fellow, Faculty [facility]

## 2021-09-22 ENCOUNTER — Encounter: Payer: Medicaid Other | Admitting: Women's Health

## 2021-09-22 ENCOUNTER — Other Ambulatory Visit: Payer: Medicaid Other

## 2021-09-22 LAB — RH IG WORKUP (INCLUDES ABO/RH)
Gestational Age(Wks): 28
Unit division: 0

## 2021-09-26 LAB — SURGICAL PATHOLOGY

## 2021-09-28 ENCOUNTER — Ambulatory Visit: Payer: Medicaid Other | Admitting: Obstetrics & Gynecology

## 2021-10-04 ENCOUNTER — Telehealth (HOSPITAL_COMMUNITY): Payer: Self-pay | Admitting: *Deleted

## 2021-10-04 NOTE — Telephone Encounter (Signed)
Call cannot be completed.   Odis Hollingshead, RN 10-04-2021 at 2:44pm

## 2021-10-10 ENCOUNTER — Encounter: Payer: Self-pay | Admitting: Women's Health

## 2021-10-19 ENCOUNTER — Ambulatory Visit: Payer: Medicaid Other | Admitting: Women's Health

## 2022-09-02 ENCOUNTER — Emergency Department
Admission: EM | Admit: 2022-09-02 | Discharge: 2022-09-02 | Disposition: A | Discharge status: Home / Self Care | Payer: Medicaid Other | Attending: Emergency Medicine | Admitting: Emergency Medicine

## 2022-09-02 ENCOUNTER — Encounter: Payer: Self-pay | Admitting: Emergency Medicine

## 2022-09-02 ENCOUNTER — Other Ambulatory Visit: Payer: Self-pay

## 2022-09-02 DIAGNOSIS — R1030 Lower abdominal pain, unspecified: Secondary | ICD-10-CM | POA: Diagnosis present

## 2022-09-02 DIAGNOSIS — R319 Hematuria, unspecified: Secondary | ICD-10-CM | POA: Insufficient documentation

## 2022-09-02 DIAGNOSIS — N390 Urinary tract infection, site not specified: Secondary | ICD-10-CM | POA: Diagnosis not present

## 2022-09-02 LAB — COMPREHENSIVE METABOLIC PANEL
ALT: 33 U/L (ref 0–44)
AST: 26 U/L (ref 15–41)
Albumin: 4.7 g/dL (ref 3.5–5.0)
Alkaline Phosphatase: 63 U/L (ref 38–126)
Anion gap: 9 (ref 5–15)
BUN: 16 mg/dL (ref 6–20)
CO2: 27 mmol/L (ref 22–32)
Calcium: 9.7 mg/dL (ref 8.9–10.3)
Chloride: 105 mmol/L (ref 98–111)
Creatinine, Ser: 0.82 mg/dL (ref 0.44–1.00)
GFR, Estimated: >60 mL/min (ref >60)
Glucose, Bld: 116 mg/dL — ABNORMAL HIGH (ref 70–99)
Potassium: 3.6 mmol/L (ref 3.5–5.1)
Sodium: 141 mmol/L (ref 135–145)
Total Bilirubin: 0.6 mg/dL (ref 0.3–1.2)
Total Protein: 8.7 g/dL — ABNORMAL HIGH (ref 6.5–8.1)

## 2022-09-02 LAB — CBC
HCT: 49.2 % — ABNORMAL HIGH (ref 36.0–46.0)
Hemoglobin: 16.1 g/dL — ABNORMAL HIGH (ref 12.0–15.0)
MCH: 30.4 pg (ref 26.0–34.0)
MCHC: 32.7 g/dL (ref 30.0–36.0)
MCV: 93 fL (ref 80.0–100.0)
Platelets: 302 10*3/uL (ref 150–400)
RBC: 5.29 MIL/uL — ABNORMAL HIGH (ref 3.87–5.11)
RDW: 13.5 % (ref 11.5–15.5)
WBC: 12.6 10*3/uL — ABNORMAL HIGH (ref 4.0–10.5)
nRBC: 0 % (ref 0.0–0.2)

## 2022-09-02 LAB — URINALYSIS, ROUTINE W REFLEX MICROSCOPIC
Bilirubin Urine: NEGATIVE
Glucose, UA: NEGATIVE mg/dL
Ketones, ur: 80 mg/dL — AB
Nitrite: NEGATIVE
Protein, ur: 100 mg/dL — AB
Specific Gravity, Urine: 1.035 — ABNORMAL HIGH (ref 1.005–1.030)
pH: 5 (ref 5.0–8.0)

## 2022-09-02 LAB — POC URINE PREG, ED: Preg Test, Ur: NEGATIVE

## 2022-09-02 LAB — LIPASE, BLOOD: Lipase: 28 U/L (ref 11–51)

## 2022-09-02 MED ORDER — CEFDINIR 300 MG PO CAPS: 300.0000 mg | ORAL_CAPSULE | Freq: Two times a day (BID) | ORAL | 0 refills | Status: DC

## 2022-09-02 MED ORDER — SODIUM CHLORIDE 0.9 % IV SOLN
1.0000 g | Freq: Once | INTRAVENOUS | Status: AC
  Administered 2022-09-02: 1 g via INTRAVENOUS
  Filled 2022-09-02: qty 10

## 2022-09-02 MED ORDER — ONDANSETRON HCL 4 MG/2ML IJ SOLN
4.0000 mg | Freq: Once | INTRAMUSCULAR | Status: AC
Start: 2022-09-02 — End: 2022-09-02
  Administered 2022-09-02: 4 mg via INTRAVENOUS
  Filled 2022-09-02: qty 2

## 2022-09-02 MED ORDER — ONDANSETRON HCL 4 MG/2ML IJ SOLN
4.0000 mg | Freq: Once | INTRAMUSCULAR | Status: AC
  Administered 2022-09-02: 4 mg via INTRAVENOUS
  Filled 2022-09-02: qty 2

## 2022-09-02 MED ORDER — SODIUM CHLORIDE 0.9 % IV BOLUS
1000.0000 mL | Freq: Once | INTRAVENOUS | Status: AC
  Administered 2022-09-02: 1000 mL via INTRAVENOUS

## 2022-09-02 MED ORDER — ONDANSETRON 4 MG PO TBDP: 4.0000 mg | ORAL_TABLET | Freq: Three times a day (TID) | ORAL | 0 refills | Status: DC | PRN

## 2022-09-02 NOTE — ED Triage Notes (Signed)
Pt via POV from home. Pt c/o lower abd pain and NV for past couple of days. Denies fevers. Denies sick contacts. Denies diarrhea. Pt is A&OX4 and NAD

## 2022-09-02 NOTE — ED Provider Triage Note (Signed)
Emergency Medicine Provider Triage Evaluation Note  Kerri Soto , a 26 y.o. female  was evaluated in triage.  Pt complains of nausea and vomiting x 2 days with intermittent abdominal cramping prior to vomiting. No fever. No sick contacts.  Physical Exam  BP (!) 132/100   Pulse 90   Temp 98.2 F (36.8 C) (Oral)   Resp 18   Ht 5\' 6"  (1.676 m)   Wt 78 kg   LMP 08/07/2022   BMI 27.76 kg/m  Gen:   Awake, no distress   Resp:  Normal effort  MSK:   Moves extremities without difficulty  Other:    Medical Decision Making  Medically screening exam initiated at 12:12 PM.  Appropriate orders placed.  13/03/2022 was informed that the remainder of the evaluation will be completed by another provider, this initial triage assessment does not replace that evaluation, and the importance of remaining in the ED until their evaluation is complete.  UA and labs collected.    [name], FNP 09/02/22 1214

## 2022-09-02 NOTE — ED Notes (Signed)
Pt reports decrease in nausea and requests discharge at this time.

## 2022-09-02 NOTE — Discharge Instructions (Signed)
Follow-up with your primary care provider or return to the emergency department if any severe worsening of your symptoms such as nausea, vomiting, fever, chills or back pain.  Take all of the antibiotic until completely finished.  Also a prescription for Zofran if needed for nausea was sent to the pharmacy as well which is every 8 hours if needed.  Increase fluids to stay hydrated.  You may also take Tylenol if needed.

## 2022-09-02 NOTE — ED Provider Notes (Signed)
[facility] Provider Note    Event Date/Time   First MD Initiated Contact with Patient 09/02/22 1357     (approximate)   History   Abdominal Pain and Emesis   HPI  Kerri Soto is a 26 y.o. female   to the ED with complaint of lower abdominal pain, nausea, vomiting for the last several days.  Patient is unaware of any fever.  She also denies diarrhea or known sick contacts.  Patient reports that she did have a urinary tract infection several months ago but this cleared completely to her knowledge.  Currently she denies any frequency, urgency or dysuria.      Physical Exam   Triage Vital Signs: ED Triage Vitals  Enc Vitals Group     BP 09/02/22 1210 (!) 132/100     Pulse Rate 09/02/22 1210 90     Resp 09/02/22 1210 18     Temp 09/02/22 1210 98.2 F (36.8 C)     Temp Source 09/02/22 1210 Oral     SpO2 09/02/22 1213 98 %     Weight 09/02/22 1211 172 lb (78 kg)     Height 09/02/22 1211 5\' 6"  (1.676 m)     Head Circumference --      Peak Flow --      Pain Score 09/02/22 1211 4     Pain Loc --      Pain Edu? --      Excl. in GC? --     Most recent vital signs: Vitals:   09/02/22 1653 09/02/22 2013  BP: (!) 137/91 (!) 145/74  Pulse: 65 74  Resp: 17 16  Temp: 98.1 F (36.7 C)   SpO2: 100% 98%     General: Awake, no distress.  CV:  Good peripheral perfusion.  Resp:  Normal effort.  Abd:  No distention.  Soft, no point tenderness.  No CVA tenderness.  Bowel sounds normoactive x 4 quadrants. Other:     ED Results / Procedures / Treatments   Labs (all labs ordered are listed, but only abnormal results are displayed) Labs Reviewed  COMPREHENSIVE METABOLIC PANEL - Abnormal; Notable for the following components:      Result Value   Glucose, Bld 116 (*)    Total Protein 8.7 (*)    All other components within normal limits  CBC - Abnormal; Notable for the following components:   WBC 12.6 (*)    RBC 5.29 (*)    Hemoglobin 16.1 (*)     HCT 49.2 (*)    All other components within normal limits  URINALYSIS, ROUTINE W REFLEX MICROSCOPIC - Abnormal; Notable for the following components:   Color, Urine AMBER (*)    APPearance CLOUDY (*)    Specific Gravity, Urine 1.035 (*)    Hgb urine dipstick SMALL (*)    Ketones, ur 80 (*)    Protein, ur 100 (*)    Leukocytes,Ua MODERATE (*)    Bacteria, UA RARE (*)    All other components within normal limits  LIPASE, BLOOD  POC URINE PREG, ED     PROCEDURES:  Critical Care performed:   Procedures   MEDICATIONS ORDERED IN ED: Medications  sodium chloride 0.9 % bolus 1,000 mL (0 mLs Intravenous Stopped 09/02/22 1653)  ondansetron (ZOFRAN) injection 4 mg (4 mg Intravenous Given 09/02/22 1517)  cefTRIAXone (ROCEPHIN) 1 g in sodium chloride 0.9 % 100 mL IVPB (0 g Intravenous Stopped 09/02/22 1653)  ondansetron (ZOFRAN) injection 4  mg (4 mg Intravenous Given 09/02/22 1742)     IMPRESSION / MDM / ASSESSMENT AND PLAN / ED COURSE  I reviewed the triage vital signs and the nursing notes.   Differential diagnosis includes, but is not limited to, abdominal pain, urinary tract infection, viral gastritis,  26 year old female presents to the ED with complaint of nausea and vomiting last couple days.  Patient denies any fever, chills or diarrhea.  Lab work included a negative pregnancy test, lipase 28, CMP essentially normal and WBC elevated at 12.6.  Urinalysis was cloudy with 80 ketones, 21-50 RBCs and WBCs with rare bacteria.  A urine culture was ordered.  Patient received Rocephin      Patient's presentation is most consistent with acute complicated illness / injury requiring diagnostic workup.  FINAL CLINICAL IMPRESSION(S) / ED DIAGNOSES   Final diagnoses:  Urinary tract infection with hematuria, site unspecified     Rx / DC Orders   ED Discharge Orders          Ordered    cefdinir (OMNICEF) 300 MG capsule  2 times daily        09/02/22 1418    ondansetron  (ZOFRAN-ODT) 4 MG disintegrating tablet  Every 8 hours PRN        09/02/22 1418             Note:  This document was prepared using Dragon voice recognition software and may include unintentional dictation errors.   Johnn Hai, PA-C 09/03/22 [id number]    Duffy Bruce, MD 09/08/22 [phone]

## 2023-04-29 ENCOUNTER — Encounter: Payer: Self-pay | Admitting: Intensive Care

## 2023-04-29 ENCOUNTER — Emergency Department
Admission: EM | Admit: 2023-04-29 | Discharge: 2023-04-29 | Disposition: A | Discharge status: Home / Self Care | Payer: Medicaid Other | Attending: Emergency Medicine | Admitting: Emergency Medicine

## 2023-04-29 ENCOUNTER — Other Ambulatory Visit: Payer: Self-pay

## 2023-04-29 DIAGNOSIS — Y92410 Unspecified street and highway as the place of occurrence of the external cause: Secondary | ICD-10-CM | POA: Insufficient documentation

## 2023-04-29 DIAGNOSIS — S39012A Strain of muscle, fascia and tendon of lower back, initial encounter: Secondary | ICD-10-CM | POA: Diagnosis not present

## 2023-04-29 DIAGNOSIS — M542 Cervicalgia: Secondary | ICD-10-CM | POA: Insufficient documentation

## 2023-04-29 DIAGNOSIS — S3992XA Unspecified injury of lower back, initial encounter: Secondary | ICD-10-CM | POA: Diagnosis present

## 2023-04-29 MED ORDER — NAPROXEN 500 MG PO TABS: 500.0000 mg | ORAL_TABLET | Freq: Two times a day (BID) | ORAL | 2 refills | Status: DC
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
## 2024-04-30 NOTE — ED Provider Notes (Signed)
 [facility] Emergency Department Provider Note   ED Clinical Impression   Final diagnoses:  First trimester pregnancy (HHS-HCC) (Primary)  Nausea and vomiting, unspecified vomiting type    Impression, Medical Decision Making, ED Course   HPI/impression/plan of care: 28 y.o. female with no significant past medical history on file presenting today for evaluation of first trimester nausea and vomiting.  Patient reports that she had a positive pregnancy test on a home urine hCG.  Date of her last menstrual period is 03/15/2024.  She is a G4 T1 P0 A2 L1.  She did not state whether this current pregnancy was planned or not, she reports that she has not been taking prenatal vitamins and has not established with an OB provider given current insurance difficulties.  She denies vaginal bleeding, vaginal spotting, abdominal pain, shortness of breath, chest pain, back pain, dysuria.  On exam she is nontoxic-appearing, she is in no acute distress, her abdomen is soft nontender nondistended, negative CVA tenderness to palpation, reassuring cardiopulmonary exam.  Primary plan today is to give short course of nausea control.  We discussed options to include Diclegis and over-the-counter options for Diclegis.  Patient is preferring Zofran  at this time and will give short course and place referral to our OB group for her to establish care.  We discussed red flag return precautions in detail.  At this time no further workup will be pursued emergency department.  She is hemodynamic stable and appropriate for discharge.     Diagnostic workup as below.   Orders Placed This Encounter  Procedures  . Pregnancy, urine  . CBC w/ Differential  . Comprehensive Metabolic Panel  . Lipase  . hCG QUANTitative, Blood  . Ambulatory referral to Obstetrics / Gynecology  . Insert peripheral IV   Results for orders placed or performed during the hospital encounter of 04/30/24  Pregnancy, urine  Result Value Ref Range    Pregnancy Test, Urine Positive (A) Negative  Comprehensive Metabolic Panel  Result Value Ref Range   Sodium 138 135 - 145 mmol/L   Potassium 4.1 3.4 - 4.8 mmol/L   Chloride 99 98 - 107 mmol/L   CO2 22.6 20.0 - 31.0 mmol/L   Anion Gap 16 (H) 5 - 14 mmol/L   BUN 8 (L) 9 - 23 mg/dL   Creatinine 9.31 9.44 - 1.02 mg/dL   BUN/Creatinine Ratio 12    eGFR CKD-EPI (2021) Female >90 >=60 mL/min/1.30m2   Glucose 89 70 - 179 mg/dL   Calcium 9.6 8.7 - 89.5 mg/dL   Albumin 4.5 3.4 - 5.0 g/dL   Total Protein 8.1 5.7 - 8.2 g/dL   Total Bilirubin 0.8 0.3 - 1.2 mg/dL   AST 21 <=65 U/L   ALT 24 10 - 49 U/L   Alkaline Phosphatase 61 46 - 116 U/L  Lipase  Result Value Ref Range   Lipase 31 12 - 53 U/L  hCG QUANTitative, Blood  Result Value Ref Range   hCG Quantitative 39,167.8 mIU/mL  CBC w/ Differential  Result Value Ref Range   WBC 9.3 3.6 - 11.2 10*9/L   RBC 4.86 3.95 - 5.13 10*12/L   HGB 15.0 (H) 11.3 - 14.9 g/dL   HCT 56.4 65.9 - 55.9 %   MCV 89.5 77.6 - 95.7 fL   MCH 30.8 25.9 - 32.4 pg   MCHC 34.4 32.0 - 36.0 g/dL   RDW 86.0 87.7 - 84.7 %   MPV 7.6 6.8 - 10.7 fL   Platelet 260 150 -  450 10*9/L   nRBC 0 <=4 /100 WBCs   Neutrophils % 62.8 %   Lymphocytes % 26.2 %   Monocytes % 9.4 %   Eosinophils % 1.0 %   Basophils % 0.6 %   Absolute Neutrophils 5.8 1.8 - 7.8 10*9/L   Absolute Lymphocytes 2.4 1.1 - 3.6 10*9/L   Absolute Monocytes 0.9 (H) 0.3 - 0.8 10*9/L   Absolute Eosinophils 0.1 0.0 - 0.5 10*9/L   Absolute Basophils 0.1 0.0 - 0.1 10*9/L       MDM Elements Discussion of Management with other Physicians, QHP or Appropriate Source: None I have reviewed recent and relevant previous record, including: None available   History   Chief Complaint Chief Complaint  Patient presents with  . Emesis      Past Medical History[1]  Past Surgical History[2]  Active Medications[3]   Allergies[4]  Family History[5]  Short Social History[6]   Physical Exam   VITAL  SIGNS:    Vitals:   04/30/24 1450 04/30/24 1616  BP: 134/72 120/75  Pulse: 84 77  Resp: 18   Temp: 36.8 C (98.2 F) 36.9 C (98.4 F)  TempSrc: Oral Oral  SpO2: 100% 100%  Weight: 93 kg (205 lb)     Constitutional: Alert and oriented. No acute distress. Eyes: Conjunctivae are normal. HEENT: Normocephalic and atraumatic. Conjunctivae clear. No congestion. Moist mucous membranes.  Cardiovascular: Rate as above, regular rhythm. Normal and symmetric distal pulses. Brisk capillary refill. Normal skin turgor. Respiratory: Normal respiratory effort. Breath sounds are normal. There are no wheezing or crackles heard. Gastrointestinal: Soft, non-distended, non-tender. Genitourinary: Deferred. Musculoskeletal: Non-tender with normal range of motion in all extremities. Neurologic: Normal speech and language. No gross focal neurologic deficits are appreciated. Patient is moving all extremities equally, face is symmetric at rest and with speech. Skin: Skin is warm, dry and intact. No rash noted. Psychiatric: Mood and affect are normal. Speech and behavior are normal.   Radiology   No orders to display    Pertinent labs & imaging results that were available during my care of the patient were independently interpreted by me and considered in my medical decision making (see chart for details).  Portions of this record have been created using Scientist, clinical (histocompatibility and immunogenetics). Dictation errors have been sought, but may not have been identified and corrected.        [1] No past medical history on file. [2] No past surgical history on file. [3] No current facility-administered medications for this encounter.   Current Outpatient Medications  Medication Sig Dispense Refill  . ondansetron  (ZOFRAN -ODT) 4 MG disintegrating tablet Take 1 tablet (4 mg total) by mouth every eight (8) hours as needed for nausea for up to 7 days. 14 tablet 1  . prenatal 21-iron fu-folic acid (PRENATAL COMPLETE) 14 mg  iron- 400 mcg Tab Take 1 tablet by mouth in the morning. 90 tablet 3  [4] No Known Allergies [5] No family history on file. [6]    Marsa Mabel Barrio, FNP 04/30/24 [phone]

## 2024-06-10 NOTE — Progress Notes (Signed)
 HPI: Kerri Soto is here to initiate prenatal care.  Patient's last menstrual period was 03/15/2024.SABRA  She is a 28 y.o., female, [id number], at [redacted]w[redacted]d based on LMP, consistent with  early ultrasound with an Estimated Date of Delivery: 12/20/24.  Patient questions or concerns: She is currently experiencing: nausea and fatigue, intermittent cramping She denies: contractions, bleeding, and loss of fluid  Ultrasound? Yes, today Planned pregnancy? no Desired pregnancy? yes Fertility Treatements? no  OB complications:  History of postpartum hemorrhage? no History of preeclampsia? no History of shoulder dystocia? no History of vacuum or forceps? no History of spontaneous PTL/PPROM? no  Breastfeeding History Tool 1. How many babies have you breastfed? 0 2. How many babies did you have problems breastfeeding? 1 3. How many babies did you feel you were able to successfully breastfeed? 0  The following portions of the patient's history were reviewed and updated as appropriate: History reviewed in detail and updated:  Past Medical History:  has no past medical history on file.  Past Surgical History:   has no past surgical history on file. Past Obstetric History:  OB History  Gravida Para Term Preterm AB Living  4 2 1 1 1 1   SAB IAB Ectopic Molar Multiple Live Births  1 0 0 0 0 1    # Outcome Date GA Lbr Len/2nd Weight Sex Type Anes PTL Lv  4 Current           3 Preterm 09/21/21 [redacted]w[redacted]d 05:25 / 00:01 0.992 kg (2 lb 3 oz) F Vag-Spont EPI  FD     Name: ELONA, YINGER  2 SAB 11/14/16          1 Term 04/19/13 [redacted]w[redacted]d 05:37 / 00:33 3.07 kg (6 lb 12.3 oz) F Vag-Spont EPI  LIV     Birth Comments: WNL     Name: GAY NELMA MANSFIELD     Apgar1: 9  Apgar5: 9    Past Family History: family history includes Diabetes in her father. Past Social History:  reports that she has been smoking. She has never used smokeless tobacco. She reports that she does not drink alcohol and does not use  drugs.  Allergies: has no known allergies.  Medications: Current Outpatient Medications on File Prior to Visit  Medication Sig Dispense Refill  . prenatal vitamin-iron-FA-DHA (PRENATE DHA) 27 mg iron-1 mg -300 mg capsule Take 1 capsule by mouth once daily    . ondansetron  (ZOFRAN -ODT) 4 MG disintegrating tablet  (Patient not taking: Reported on 06/10/2024)     No current facility-administered medications on file prior to visit.   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 no  Infectious Screening: Risk factors for TB? no Varicella infection in the past or immunization: no Cats in home: no Zika virus exposure:  no Flu vaccine: will receive fall 2025 Covid-19 vaccine: Reviewed options, patient: declines History of blood transfusion: no  Planning/Education: Willing to accept a blood transfusion if her life depended on it? yes Have you received Rhogam with a past pregnancy or during this pregnancy? yes Patient informed that HIV testing will be performed during her prenatal care and agrees to testing: yes  ASA Therapy in Pregnancy:    Low-dose aspirin (81mg  per day) started between 12 and 28 weeks of gestation  is used  to reduce the occurrence of preeclampsia, preterm birth, and intrauterine growth restriction in women at increased risk of preeclampsia.   Risk factors in this pregnancy:  Moderate risk factor (2 or more): Obesity - BMI > 30, Sociodemographic characteristics , and Personal history of adverse pregnancy outcome High risk factor (1 or more): None  Low dose aspirin indicated.   MyChart: MyChart introduced and encouraged for non-urgent concerns.  Patient Active Problem List  Diagnosis  . 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 Review of Systems  Constitutional: Negative.   Respiratory: Negative.    Cardiovascular: Negative.   Gastrointestinal:  Positive for nausea.  Genitourinary: Negative.   Musculoskeletal: Negative.   Neurological: Negative.   Psychiatric/Behavioral: Negative.       EXAM: VITALS:  Vitals:   06/10/24 1402  BP: 123/83  Pulse: 96   Body mass index is 32.26 kg/m.  Physical Exam Constitutional:      Appearance: Normal appearance.  Cardiovascular:     Rate and Rhythm: Normal rate.  Pulmonary:     Effort: Pulmonary effort is normal.  Abdominal:     Palpations: Abdomen is soft.  Musculoskeletal:        General: Normal range of motion.  Neurological:     Mental Status: She is alert and oriented to person, place, and time.  Skin:    General: Skin is warm and dry.  Psychiatric:        Mood and Affect: Mood normal.        Behavior: Behavior normal.        Thought Content: Thought content normal.        Judgment: Judgment normal.     OB US : IUP measuring [redacted]w[redacted]d c/w previously assigned dates FHR 150bpm Intramural anterior/fundal fibroid, 2.0cm x 1.4cm x 1.6cm Cervical length 4.5cm Bilateral ovaries WNL Previously seen fetal mid-gut herniation has resolved. Normal abdominal cord insertion visualized. [facility] seen superiour to gestational sac measuring 2.1cm x 1.3cm x 1.5cm  Assessment:   28 y.o. [id number] @ at [redacted]w[redacted]d with   1. Supervision of high risk pregnancy in first trimester (HHS-HCC) -     CBC/D/Plt+RPR+Rh+ABO+RubIgG... - [facility] -     Hgb Fractionation Cascade - [facility] -     Rapid HIV -     Urinalysis w/Microscopic -     Urine Culture, Prenatal, w/GBS - [facility] -     Inheritest(R) CF/SMA Panel - [facility] -     MaterniT21 PLUS Core+SCA - [facility]  2. Cervical cancer screening -     PAP IGP, CtNg, rfx  Aptima HPV ASCU - [facility]  3. Screening for HPV (human papillomavirus)  4. Routine screening for STI (sexually transmitted infection) -     PAP IGP, CtNg, rfx Aptima HPV ASCU - [facility]  5. Screening for diabetes mellitus -     Hemoglobin A1C  6. Screening for thyroid disorder -     Thyroid Stimulating-Hormone (TSH)  7. Obesity in pregnancy, antepartum (HHS-HCC) -     Hemoglobin A1C -     Comprehensive Metabolic Panel (CMP) -     Prot+CreatU (Random) - [facility]    1) Supervision of pregnancy: Standard OB labs ordered, including HIV, GC/CT, and urine cultures. Verbal consent obtained for HIV testing. Pap performed  . Reviewed nutrition, diet, and exercise in pregnancy. Healthy diet and lifestyle choices encouraged.  Reviewed  dietary instructions and restrictions: increased protein, fruits and vegetables daily and ample water intake; avoiding large predator fish, unpasturized dairy products and any recalled products to avoid listeriosis, raw meat, raw fish, and raw shellfish. Discussed risks of Zika, has not been to Zika region, toxoplasmosis does not have indoor cats/does not scoop cat litter.  Reviewed prenatal vitamin - one everyday. Precautions, limitations, and expectations reviewed Reviewed normal pregnancy discomforts and comfort measures  OTC meds reviewed  2) Pregnancy BMI: Body mass index is 32.26 kg/m. Recommended weight gain discussed. 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 Limitations of testing reviewed.   Discussed cfDNA screen at 10 and greater to test for aneuploidy. Patient desires this screening.  Discussed AFP/quad screen between 15 & 21 weeks. Patient desires. Discussed the Anatomical Ultrasound to be done by 20 weeks here.  5) Oriented to [facility] practice model:  I have reviewed the complimentary nature of our practice which incorporates diverse care providers including certified nurse midwives and physicians. I have reviewed that we each provide a unique perspective on pregnancy and birth experience that will enhance their experience and encourage that patient/couple seek to see all of our highly qualified providers. I have reviewed that most patients will have approximately 10-12 visits and an opportunity to meet all members of their care team. Shared call  between providers and [facility] site of delivery were also discussed as well as resources for childbirth and breastfeeding education at the [facility] campus. The [facility]YN Prenatal Booklet was also reviewed with the patient.  Call for any bleeding, cramping or worsening of symptoms.  There are no diagnoses linked to this encounter.  Reviewed early pregnancy precautions.  Call for bleeding and cramping  Return in about 4 weeks (around 07/08/2024) for routine Prenatal.  A total of 30 minutes were spent face to face with the patient, the majority of the time was spent on counseling and coordination of care.  I personally performed the service, non-incident to. [facility])  EDSEL CHARLIES BLUSH, CNM 06/10/2024

## 2024-07-08 NOTE — Progress Notes (Signed)
 ROUTINE OB VISIT  Kerri Soto 28 y.o. [id number] at [redacted]w[redacted]d  Factors complicating this pregnancy: -Hx of IUFD, unknown cause, at 28wks. Has appt with MFM next week. On baby ASA  S: No LOF, VB. Denies dysuria, abnormal discharge.  O: BP 113/78   Pulse 88   Ht 170.2 cm (5' 7)   Wt 94.5 kg (208 lb 6.4 oz)   LMP 03/15/2024   BMI 32.64 kg/m   Abdomen: Gravid, nontender; Fundal height: s=d Ext : no edema, no rashes SVE deferred  Fetal heart tones: 150 distinguished from mom  A/P: IUP at [redacted]w[redacted]d  Precautions and warning s/s reviewed   RTC in 2 weeks  Diagnoses and all orders for this visit:  Encounter for supervision of high-risk pregnancy with history of infertility in second trimester (HHS-HCC) -     US  FDC OB greater than 14 weeks single gest routine anatomy; Future  Need for immunization against influenza -     FLU VACCINE MDCK IIV3(EGG FREE), IM PF, (73MO+)(FLUCELVAX)  Elevated antibody levels -     Antibody Screen - [facility]   Did not ask about AFP Triggered patient into weeping with difficulty hearing FHT with Doppler and took a while to find the ipad  [facility] Kerri BEASLEY, MD

## 2024-07-13 NOTE — Progress Notes (Signed)
 [facility]  Maternal Fetal Medicine Consultation Note   Date of Consult:  07/14/2024   Consulting Attending: Maple DOROTHA Ouida Jacquline, MD, PhD   Consulting Provider:  MARYELIZABETH CHARLOTTA JAMES   Referring Provider:   Tanda Edsel Fuller [address] [place],  [place] [address]    Reason for Consultation: History of IUFD at 28 weeks  History of Present Illness   ID: Kerri Soto is a 28 y.o. female [phone] at [redacted]w[redacted]d who is seen in consultation for history of IUFD in G2 pregnancy.   Subjective: Ms. Stanek presents today for consultation for history of intrauterine demise at 28 weeks in her last pregnancy in 2022. This was diagnosed at [redacted]w[redacted]d GA in clinic at [facility]) in [place], [place]. She had experienced decreased fetal movement for several days leading up to the diagnosis. She was ultimately induced and delivered via NSVD. She reports that she had RUQ at the time of the loss, and is worried this was related. She has also been experiencing RUQ recently, and so this is worrying to her.   Receiving prenatal care at [facility] in [place].   Review of Systems: A 12 point review of systems reviewed as negative except as listed in the HPI.   OB History  Gravida Para Term Preterm AB Living  4 2 1 1 1 1   SAB IAB Ectopic Molar Multiple Live Births  1     1    # Outcome Date GA Lbr Len/2nd Weight Sex Type Anes PTL Lv  4 Current           3 Preterm 09/21/21 [redacted]w[redacted]d 05:25 / 00:01 0.992 kg (2 lb 3 oz) F Vag-Spont EPI  FD  2 SAB 11/14/16          1 Term 04/19/13 [redacted]w[redacted]d 05:37 / 00:33 3.07 kg (6 lb 12.3 oz) F Vag-Spont EPI  LIV     Birth Comments: WNL    No past medical history on file.   No past surgical history on file.   Family History  Problem Relation Name Age of Onset  . Diabetes Father      Social History:  Social History   Tobacco Use  . Smoking status: Former    Types: Cigarettes    Passive exposure: Past  .  Smokeless tobacco: Former  Substance Use Topics  . Alcohol use: Never     Allergies: No Known Allergies    Outpatient Medications Marked as Taking for the 07/14/24 encounter (Initial consult) with MFM FELLOWS  Medication Sig Dispense Refill  . ASPIRIN ORAL Take by mouth    . prenatal vitamin-iron-FA-DHA (PRENATE DHA) 27 mg iron-1 mg -300 mg capsule Take 1 capsule by mouth once daily      Objective:   Vitals:   07/14/24 0852  BP: 117/81  Pulse: 92    Physical Exam  General appearance: Well developed, well nourished, in no acute distress    Mental status: Awake and alert, normal mood and affect  Chest: No increased of breathing, on room air Heart: Regular rate, warm and well perfused Abdomen: Gravid, non-tender to palpation. Negative Murphy's sign.  Pelvic: Deferred  Musculoskeletal/Extremities: Lower extremities without edema or varicies, normal hip flexion   Fetal cardiac activity present: By beside US     PLACENTA PATHOLOGY (08/2021)  PLACENTA, SINGLETON, DELIVERY:  - Singleton gestation placenta with three-vessel umbilical cord, 238 g.   GROSS DESCRIPTION:  Specimen received: Received fresh  Size and shape: An  ovoid singleton placenta measuring 16.5 x 13.0 x 2.3 cm  Weight: 238 g  Umbilical cord: Trivascular umbilical cord is tan-white, rubbery, 13.1 cm in length, averages 1.2 cm in diameter, centrally inserted, and displays minimal twisting.  Membranes: Membranes are marginally inserted, tan, smooth, thickened and  opaque  Fetal surface: Tan-purple, focally granular, with small caliber vessels  Maternal surface: Maternal surface is complete with poorly defined red-brown cotyledons, minimal calcifications, and a minimal amount of  adherent red-brown clotted blood  Cut surface: Spongy tan-red, and no lesions are grossly identified   Final Diagnosis performed by Mark Jordan, MD. Electronically signed 09/26/2021   Assessment/Recommendations:  Ms. Meyering is a 28 y.o.  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 Nembhard's history of IUFD at 28 weeks. We reviewed that her pregnancy was uncomplicated, and there is no obvious reason why she unfortunately experienced a stillbirth. No hypertensive diagnoses, such as pre-eclampsia; I reviewed her BPs from that pregnancy, and all were normal. She did complain of RUQ at her clinic visit at which time the IUFD was diagnosed, though no CMP was sent to evaluate LFTs. Platelets were reassuringly normal. It appears unlikely that she developed a hypertensive disorder such as pre-eclampsia. It does not appear that the pregnancy was complicated by growth restriction. She had a negative KB at time of delivery, and therefore fetal-maternal hemorrhage not apparent. RPR was non-reactive.   We also reviewed her placental pathology which was generally unremarkable and unrevealing as to the cause of her stillbirth. She did not have APLS antibodies sent; we will send these today. She did not a fetal MRI or a fetal autopsy. The fetus did appear grossly normal, however, per her report. Karyotype and microarray were not sent. Her fetus was not growth restricted.  Based on the above findings, the etiology of her IUFD is unclear. We reviewed that the chance of recurrence of IUFD is increased compared to those without a history of stillbirth (pooled OR 4.83, 95% CI 3.77-6.18, ACOG PB #10, Management of Stillbirth), though is still unlikely to repeat itself.   Moving forward, we discussed genetic counseling and a targeted anatomy ultrasound at 18-20 weeks. She has had NIPT this pregnancy, which was risk-reducing. We discussed an ultrasound for fetal growth at 28 weeks.   We also discussed the time at which to begin antenatal testing. It is not unreasonable to begin antenatal testing at the same gestational age at which a previous  stillbirth occurred. In Ms. Suriano's case, this would fall at 28 weeks. I discussed the benefits and shortcomings of this approach to beginning antenatal testing at this gestation, as it is quite early, and sometimes we will appreciate a problem on antenatal testing that may prompt additional intervention, such as an iatrogenic preterm delivery. Having discussed this, Ms. Creed favors beginning antenatal monitoring 26-28 weeks with BPPs, and then transitioning to NSTs.   We reviewed timing of delivery given her prior loss at 28 weeks. At this point, we would recommend delivery between 37 and 39 weeks. Ms. Fluellen strongly favors delivery in the earlier portion of this window, which is understandable.   We discussed risk factors that have been associated with stillbirth, including advanced maternal age, obesity, pre-existing diabetes, smoking, chronic hypertension, alcohol use, non-Hispanic black race, female fetal sex, nulliparity, multiple gestation. Ms. Thor does not have any reversible risk factors at this time. Of note, her  A1c is 6.1%, in the pre-diabetes range, though she passed the early GTT (failed 1-hour and passed 3-hour). TSH normal at 0.295. She will have Glucola at 28 weeks, and so this will again evaluate for diabetes, which should be optimally managed to reduce the risk of stillbirth, should she develop the condition.    Recommendations:  - Targeted US  at 18-20 weeks, scheduled for 11/5 - Continue ASA 81 mg - Serial growth sonography in the third trimester - APLS labs drawn today - Weekly antenatal testing 26-28 weeks, with increased frequency at 34-36 weeks with twice weekly NSTs - Delivery at 37-39 weeks; while ACOG recommends delivery at 39 weeks for people with a history of IUFD, they do acknowledge that early term delivery may be reasonably offered for patients who express concern regarding IUFD recurrence and have been counseled about the risks of early term  birth.  Antibody-positive (unidentified)  - Reported non-specific activity in the antiglobulin phase of testing resulting in positive antibody screen on 06/10/2024. May represent an antibody that is too weak to titer. Follow-up testing was recommended in one month, due now.  - Will obtain repeat antibody screen today; follow-up based on these results - Denies any history of blood transfusion. Received all doses of Anti-D during last delivery and adjacent to delivery. KB screen was negative.   RUQ Pain I commiserated with Ms. Urbanski regarding her right upper quadrant pain; she is very worried this is related to her prior pregnancy loss, because she was experiencing the same symptoms at that time in her last pregnancy. On exam today, palpation of the RUQ below the liver elicits minor discomfort. She reports the pain worsens with eating. Given prior to 20w GA and normal BP, this is not almost certainly not liver capsule inflammation that can be associated with pre-eclampsia, which by definition does not develop at this GA. I suspect that she has underlying gallbladder issues, perhaps gallstones, causing this colicky discomfort. Given chronicity of pain going on several years, will order RUQ US  today.  - CMP today - RUQ US  to assess for gallstones   - Omeprazole Rx sent to pharmacy   The patient's history, acute issues, and concerns were discussed in detail with Dr. Jacquline (MFM), who agrees with the management plan as outlined above.   Total time spent with the patient was 60 minutes with greater than 50% spent in counseling and coordination of care.   Thank you for the courtesy of this kind consultation. We appreciate this interesting consult and will be happy to be involved in the ongoing care of Ms. Ashley in anyway her obstetricians desire.   Arleene James, MD, PGY6 Fellow, Maternal Fetal Medicine  Maternal Fetal Medicine Staff Attending  This service was rendered under my overall  direction and control, and I was immediately available via phone/pager or present on site.  Maple Popp) Jacquline, MD, PhD Assistant Professor, Maternal Fetal Medicine Department of Obstetrics and Gynecology [facility]

## 2024-07-28 ENCOUNTER — Encounter: Payer: Self-pay | Admitting: Emergency Medicine

## 2024-07-28 ENCOUNTER — Emergency Department

## 2024-07-28 ENCOUNTER — Other Ambulatory Visit: Payer: Self-pay

## 2024-07-28 ENCOUNTER — Emergency Department
Admission: EM | Admit: 2024-07-28 | Discharge: 2024-07-28 | Disposition: A | Discharge status: Home / Self Care | Attending: Emergency Medicine | Admitting: Emergency Medicine

## 2024-07-28 DIAGNOSIS — R1013 Epigastric pain: Secondary | ICD-10-CM | POA: Diagnosis not present

## 2024-07-28 DIAGNOSIS — O26892 Other specified pregnancy related conditions, second trimester: Secondary | ICD-10-CM | POA: Diagnosis present

## 2024-07-28 DIAGNOSIS — Z3A19 19 weeks gestation of pregnancy: Secondary | ICD-10-CM | POA: Insufficient documentation

## 2024-07-28 LAB — CBC WITH DIFFERENTIAL/PLATELET
Abs Immature Granulocytes: 0.07 K/uL (ref 0.00–0.07)
Basophils Absolute: 0 K/uL (ref 0.0–0.1)
Basophils Relative: 0 %
Eosinophils Absolute: 0.2 K/uL (ref 0.0–0.5)
Eosinophils Relative: 1 %
HCT: 35.2 % — ABNORMAL LOW (ref 36.0–46.0)
Hemoglobin: 11.2 g/dL — ABNORMAL LOW (ref 12.0–15.0)
Immature Granulocytes: 1 %
Lymphocytes Relative: 22 %
Lymphs Abs: 2.3 K/uL (ref 0.7–4.0)
MCH: 29.4 pg (ref 26.0–34.0)
MCHC: 31.8 g/dL (ref 30.0–36.0)
MCV: 92.4 fL (ref 80.0–100.0)
Monocytes Absolute: 0.8 K/uL (ref 0.1–1.0)
Monocytes Relative: 7 %
Neutro Abs: 7.2 K/uL (ref 1.7–7.7)
Neutrophils Relative %: 69 %
Platelets: 243 K/uL (ref 150–400)
RBC: 3.81 MIL/uL — ABNORMAL LOW (ref 3.87–5.11)
RDW: 14 % (ref 11.5–15.5)
WBC: 10.5 K/uL (ref 4.0–10.5)
nRBC: 0 % (ref 0.0–0.2)

## 2024-07-28 LAB — URINALYSIS, ROUTINE W REFLEX MICROSCOPIC
Bilirubin Urine: NEGATIVE
Glucose, UA: NEGATIVE mg/dL
Hgb urine dipstick: NEGATIVE
Ketones, ur: NEGATIVE mg/dL
Leukocytes,Ua: NEGATIVE
Nitrite: NEGATIVE
Protein, ur: NEGATIVE mg/dL
Specific Gravity, Urine: 1.024 (ref 1.005–1.030)
pH: 7 (ref 5.0–8.0)

## 2024-07-28 LAB — BASIC METABOLIC PANEL WITH GFR
Anion gap: 7 (ref 5–15)
BUN: 8 mg/dL (ref 6–20)
CO2: 23 mmol/L (ref 22–32)
Calcium: 8.7 mg/dL — ABNORMAL LOW (ref 8.9–10.3)
Chloride: 105 mmol/L (ref 98–111)
Creatinine, Ser: 0.43 mg/dL — ABNORMAL LOW (ref 0.44–1.00)
GFR, Estimated: >60 mL/min (ref >60)
Glucose, Bld: 90 mg/dL (ref 70–99)
Potassium: 4.1 mmol/L (ref 3.5–5.1)
Sodium: 135 mmol/L (ref 135–145)

## 2024-07-28 LAB — HEPATIC FUNCTION PANEL
ALT: 10 U/L (ref 0–44)
AST: 12 U/L — ABNORMAL LOW (ref 15–41)
Albumin: 3 g/dL — ABNORMAL LOW (ref 3.5–5.0)
Alkaline Phosphatase: 34 U/L — ABNORMAL LOW (ref 38–126)
Bilirubin, Direct: <0.1 mg/dL (ref 0.0–0.2)
Total Bilirubin: 0.2 mg/dL (ref 0.0–1.2)
Total Protein: 6.8 g/dL (ref 6.5–8.1)

## 2024-07-28 LAB — LIPASE, BLOOD: Lipase: 23 U/L (ref 11–51)

## 2024-07-28 NOTE — Discharge Instructions (Signed)
 You may take the acid blocking medication that you are prescribed as needed.  It works better if you take it daily, even if you are not having symptoms at that time.  Follow-up with your outpatient OB/GYN as scheduled.  Return to the ER for new, worsening, or persistent severe abdominal pain, vomiting, fever, vaginal bleeding, or any other new or worsening symptoms that concern you.

## 2024-07-28 NOTE — ED Triage Notes (Signed)
 Abd pain x 1 day.  Patient is [redacted] weeks pregnant G4 P1, seen 2 weeks ago at [facility] for same.  No vaginal drainage or bleeding.  [facility] group  Patient is AAOx3. Skin warm and dry. NAD

## 2024-07-28 NOTE — ED Provider Notes (Signed)
 [facility] Provider Note    Event Date/Time   First MD Initiated Contact with Patient 07/28/24 0920     (approximate)   History   Abdominal Pain   HPI  Kerri Soto is a 28 y.o. female [phone] at 34 weeks followed by [facility] for history of intrauterine fetal demise with a prior pregnancy who presents with right upper quadrant abdominal pain, mainly under her right ribs, sharp, present since yesterday evening, persistent course, not associated with any nausea or vomiting.  She has had episodes of this pain before, especially after eating fatty foods.  She denies any diarrhea.  She has no lower abdominal pain.  Denies any vaginal bleeding, fluid leakage, or discharge.  She has no urinary symptoms.  I reviewed the past medical records.  The patient was most recently seen by [facility] on 10/14 for an initial consultation for her high risk pregnancy.  She reported the right upper quadrant pain at that time.  She had an outpatient RUQ ultrasound on 10/20 which was negative for cholelithiasis or cholecystitis.   Physical Exam   Triage Vital Signs: ED Triage Vitals  Encounter Vitals Group     BP 07/28/24 0919 (!) 143/84     Girls Systolic BP Percentile --      Girls Diastolic BP Percentile --      Boys Systolic BP Percentile --      Boys Diastolic BP Percentile --      Pulse Rate 07/28/24 0919 95     Resp 07/28/24 0919 16     Temp 07/28/24 0919 98.2 F (36.8 C)     Temp Source 07/28/24 0919 Oral     SpO2 07/28/24 0919 99 %     Weight 07/28/24 0914 208 lb (94.3 kg)     Height --      Head Circumference --      Peak Flow --      Pain Score 07/28/24 0914 6     Pain Loc --      Pain Education --      Exclude from Growth Chart --     Most recent vital signs: Vitals:   07/28/24 1100 07/28/24 1130  BP: 90/67 106/67  Pulse:    Resp:    Temp:    SpO2:       General: Alert, well-appearing, no distress.  CV:  Good peripheral perfusion.   Resp:  Normal effort.  Abd:  Mild right upper quadrant tenderness.  No lower abdominal tenderness.  No distention.  Other:  No jaundice or scleral icterus.   ED Results / Procedures / Treatments   Labs (all labs ordered are listed, but only abnormal results are displayed) Labs Reviewed  BASIC METABOLIC PANEL WITH GFR - Abnormal; Notable for the following components:      Result Value   Creatinine, Ser 0.43 (*)    Calcium 8.7 (*)    All other components within normal limits  HEPATIC FUNCTION PANEL - Abnormal; Notable for the following components:   Albumin 3.0 (*)    AST 12 (*)    Alkaline Phosphatase 34 (*)    All other components within normal limits  CBC WITH DIFFERENTIAL/PLATELET - Abnormal; Notable for the following components:   RBC 3.81 (*)    Hemoglobin 11.2 (*)    HCT 35.2 (*)    All other components within normal limits  URINALYSIS, ROUTINE W REFLEX MICROSCOPIC - Abnormal; Notable for the following components:  Color, Urine YELLOW (*)    APPearance HAZY (*)    All other components within normal limits  LIPASE, BLOOD     EKG     RADIOLOGY  US  OB: I independently viewed and interpreted the images; there is an IUP with FHR consistent with 19 weeks 2 days.  There is no free fluid.  Radiology report indicates no acute abnormality on this limited ultrasound.  PROCEDURES:  Critical Care performed: No  Procedures   MEDICATIONS ORDERED IN ED: Medications - No data to display   IMPRESSION / MDM / ASSESSMENT AND PLAN / ED COURSE  I reviewed the triage vital signs and the nursing notes.  28 year old female currently [redacted] weeks pregnant with PMH as noted above presents with recurrent right upper quadrant abdominal pain.  On exam she has mild tenderness to the area.  She was ruled out for gallstones 8 days ago.  Differential diagnosis includes, but is not limited to, gastritis, GERD, pancreatitis, UTI, round ligament or other benign pregnancy related pain, less  likely pregnancy complication.  We will obtain labs, limited OB ultrasound, and reassess.  There is no indication for repeat right upper quadrant imaging or for CT.  Patient's presentation is most consistent with acute complicated illness / injury requiring diagnostic workup.  ----------------------------------------- 1:02 PM on 07/28/2024 -----------------------------------------  Ultrasound is negative for acute findings.  BMP and CBC show no acute abnormalities.  LFTs and lipase are also unremarkable.  The patient has no pain at this time and has not required any medication while in the ED.  She also states that she has not taken the PPI she was prescribed after previously presenting with similar pain.  I encouraged her to do so.  She will follow-up with her outpatient OB/GYN.  She is stable for discharge at this time and feels comfortable going home.  I gave strict return precautions, and she expressed understanding.  FINAL CLINICAL IMPRESSION(S) / ED DIAGNOSES   Final diagnoses:  Epigastric abdominal pain     Rx / DC Orders   ED Discharge Orders     None        Note:  This document was prepared using Dragon voice recognition software and may include unintentional dictation errors.    Jacolyn Pae, MD 07/28/24 [phone]

## 2024-10-18 ENCOUNTER — Other Ambulatory Visit: Payer: Self-pay

## 2024-10-18 ENCOUNTER — Encounter: Payer: Self-pay | Admitting: Obstetrics and Gynecology

## 2024-10-18 ENCOUNTER — Observation Stay
Admission: EM | Admit: 2024-10-18 | Discharge: 2024-10-18 | Disposition: A | Discharge status: Home / Self Care | Attending: Obstetrics and Gynecology | Admitting: Obstetrics and Gynecology

## 2024-10-18 DIAGNOSIS — Z3A31 31 weeks gestation of pregnancy: Secondary | ICD-10-CM | POA: Diagnosis not present

## 2024-10-18 DIAGNOSIS — O24419 Gestational diabetes mellitus in pregnancy, unspecified control: Secondary | ICD-10-CM | POA: Insufficient documentation

## 2024-10-18 DIAGNOSIS — Z3490 Encounter for supervision of normal pregnancy, unspecified, unspecified trimester: Principal | ICD-10-CM

## 2024-10-18 DIAGNOSIS — O368130 Decreased fetal movements, third trimester, not applicable or unspecified: Principal | ICD-10-CM | POA: Insufficient documentation

## 2024-10-18 NOTE — OB Triage Note (Signed)
 Pt being discharged home stable and ambulatory by [facility] MD. Her fetal monitoring showed a Category 1 strip and pt has felt baby move since being here. Discharge instructions reviewed with pt and pt verbalized understanding. Pt knows to return here for vaginal bleeding, ctx's, decreased fetal movement, or LOF. Discharge papers handed to pt.

## 2024-10-18 NOTE — OB Triage Note (Signed)
 29 y.o G4P1 presents to L&D triage at [redacted]w[redacted]d for decreased fetal movement since this morning. She denies vaginal bleeding, lof, ctx's, or other concerning symptoms. She reports that she's felt baby move since arriving here to triage. Pt is in no pain and vitals are wnl. Pt has gestational diabetes and is currently not taking any medications. Fetal monitors applied and assessing. POC includes NST and monitoring. [place], MD aware of pt arrival.

## 2024-10-20 NOTE — Discharge Summary (Signed)
 Triage Visit with NST    Kerri Soto is a 29 y.o. [id number]. She is at [redacted]w[redacted]d gestation.  Indication: Decreased fetal movement  S: Resting comfortably. no CTX, no VB.  - Patient is now feeling baby  Move well.   :  LMP 03/15/2024   SpO2 99%  No results found for this or any previous visit (from the past 48 hours).   Gen: NAD, AAOx3      Abd: FNTTP      Ext: Non-tender, Nonedmeatous    FHT: 140, moderate variability, +accels, no decels TOCO: quiet SVE: deferred   A/P:  29 y.o. [id number] [redacted]w[redacted]d with concerns for decreased fetal movement, now resolved.  Labor: not present.  Fetal Wellbeing: NST is Reassuring reactive tracing  D/c home stable, precautions reviewed, follow-up as scheduled.
